# Patient Record
Sex: Female | Born: 2000
Health system: Southern US, Community
[De-identification: ages and names within clinical notes are randomized; demographics above are authoritative.]

## PROBLEM LIST (undated history)

## (undated) DIAGNOSIS — J45909 Unspecified asthma, uncomplicated: Secondary | ICD-10-CM

## (undated) DIAGNOSIS — F411 Generalized anxiety disorder: Secondary | ICD-10-CM

## (undated) DIAGNOSIS — J189 Pneumonia, unspecified organism: Secondary | ICD-10-CM

## (undated) DIAGNOSIS — J4520 Mild intermittent asthma, uncomplicated: Secondary | ICD-10-CM

## (undated) HISTORY — DX: Mild intermittent asthma, uncomplicated: J45.20

## (undated) HISTORY — PX: NO PAST SURGERIES: SHX2092

## (undated) HISTORY — DX: Pneumonia, unspecified organism: J18.9

## (undated) HISTORY — DX: Unspecified asthma, uncomplicated: J45.909

## (undated) HISTORY — DX: Generalized anxiety disorder: F41.1

---
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
## 2019-04-15 ENCOUNTER — Other Ambulatory Visit: Payer: Self-pay

## 2019-04-15 ENCOUNTER — Encounter: Payer: Self-pay | Admitting: Physician Assistant

## 2019-04-15 ENCOUNTER — Ambulatory Visit: Payer: BC Managed Care – PPO | Admitting: Physician Assistant

## 2019-04-15 VITALS — BP 132/81 | HR 84 | Temp 99.1°F | Ht <= 58 in | Wt 128.0 lb

## 2019-04-15 DIAGNOSIS — F411 Generalized anxiety disorder: Secondary | ICD-10-CM

## 2019-04-15 DIAGNOSIS — F39 Unspecified mood [affective] disorder: Secondary | ICD-10-CM | POA: Diagnosis not present

## 2019-04-15 DIAGNOSIS — F331 Major depressive disorder, recurrent, moderate: Secondary | ICD-10-CM | POA: Diagnosis not present

## 2019-04-15 DIAGNOSIS — Z7289 Other problems related to lifestyle: Secondary | ICD-10-CM | POA: Diagnosis not present

## 2019-04-15 MED ORDER — LAMOTRIGINE 25 MG PO TABS: 25.0000 mg | ORAL_TABLET | Freq: Every day | ORAL | 0 refills | Status: DC

## 2019-04-15 MED ORDER — ALBUTEROL SULFATE HFA 108 (90 BASE) MCG/ACT IN AERS: 2.0000 | INHALATION_SPRAY | Freq: Four times a day (QID) | RESPIRATORY_TRACT | 2 refills | Status: DC | PRN

## 2019-04-15 NOTE — Patient Instructions (Signed)
Lamotrigine tablets What is this medicine? LAMOTRIGINE (la MOE tri jeen) is used to control seizures in adults and children with epilepsy and Lennox-Gastaut syndrome. It is also used in adults to treat bipolar disorder. This medicine may be used for other purposes; ask your health care provider or pharmacist if you have questions. COMMON BRAND NAME(S): Lamictal, Subvenite What should I tell my health care provider before I take this medicine? They need to know if you have any of these conditions:  aseptic meningitis during prior use of lamotrigine  depression  folate deficiency  kidney disease  liver disease  suicidal thoughts, plans, or attempt; a previous suicide attempt by you or a family member  an unusual or allergic reaction to lamotrigine or other seizure medications, other medicines, foods, dyes, or preservatives  pregnant or trying to get pregnant  breast-feeding How should I use this medicine? Take this medicine by mouth with a glass of water. Follow the directions on the prescription label. Do not chew these tablets. If this medicine upsets your stomach, take it with food or milk. Take your doses at regular intervals. Do not take your medicine more often than directed. A special MedGuide will be given to you by the pharmacist with each new prescription and refill. Be sure to read this information carefully each time. Talk to your pediatrician regarding the use of this medicine in children. While this drug may be prescribed for children as young as 2 years for selected conditions, precautions do apply. Overdosage: If you think you have taken too much of this medicine contact a poison control center or emergency room at once. NOTE: This medicine is only for you. Do not share this medicine with others. What if I miss a dose? If you miss a dose, take it as soon as you can. If it is almost time for your next dose, take only that dose. Do not take double or extra doses. What may  interact with this medicine?  atazanavir  carbamazepine  female hormones, including contraceptive or birth control pills  lopinavir  methotrexate  phenobarbital  phenytoin  primidone  pyrimethamine  rifampin  ritonavir  trimethoprim  valproic acid This list may not describe all possible interactions. Give your health care provider a list of all the medicines, herbs, non-prescription drugs, or dietary supplements you use. Also tell them if you smoke, drink alcohol, or use illegal drugs. Some items may interact with your medicine. What should I watch for while using this medicine? Visit your doctor or health care provider for regular checks on your progress. If you take this medicine for seizures, wear a Medic Alert bracelet or necklace. Carry an identification card with information about your condition, medicines, and doctor or health care provider. It is important to take this medicine exactly as directed. When first starting treatment, your dose will need to be adjusted slowly. It may take weeks or months before your dose is stable. You should contact your doctor or health care provider if your seizures get worse or if you have any new types of seizures. Do not stop taking this medicine unless instructed by your doctor or health care provider. Stopping your medicine suddenly can increase your seizures or their severity. This medicine may cause serious skin reactions. They can happen weeks to months after starting the medicine. Contact your health care provider right away if you notice fevers or flu-like symptoms with a rash. The rash may be red or purple and then turn into blisters or peeling   of the skin. Or, you might notice a red rash with swelling of the face, lips or lymph nodes in your neck or under your arms. You may get drowsy, dizzy, or have blurred vision. Do not drive, use machinery, or do anything that needs mental alertness until you know how this medicine affects you. To  reduce dizzy or fainting spells, do not sit or stand up quickly, especially if you are an older patient. Alcohol can increase drowsiness and dizziness. Avoid alcoholic drinks. If you are taking this medicine for bipolar disorder, it is important to report any changes in your mood to your doctor or health care provider. If your condition gets worse, you get mentally depressed, feel very hyperactive or manic, have difficulty sleeping, or have thoughts of hurting yourself or committing suicide, you need to get help from your health care provider right away. If you are a caregiver for someone taking this medicine for bipolar disorder, you should also report these behavioral changes right away. The use of this medicine may increase the chance of suicidal thoughts or actions. Pay special attention to how you are responding while on this medicine. Your mouth may get dry. Chewing sugarless gum or sucking hard candy, and drinking plenty of water may help. Contact your doctor if the problem does not go away or is severe. Women who become pregnant while using this medicine may enroll in the [place] Antiepileptic Drug Pregnancy Registry by calling 1-888-233-2334. This registry collects information about the safety of antiepileptic drug use during pregnancy. This medicine may cause a decrease in folic acid. You should make sure that you get enough folic acid while you are taking this medicine. Discuss the foods you eat and the vitamins you take with your health care provider. What side effects may I notice from receiving this medicine? Side effects that you should report to your doctor or health care professional as soon as possible:  allergic reactions like skin rash, itching or hives, swelling of the face, lips, or tongue  changes in vision  depressed mood  elevated mood, decreased need for sleep, racing thoughts, impulsive behavior  loss of balance or coordination  mouth sores  rash, fever, and  swollen lymph nodes  redness, blistering, peeling or loosening of the skin, including inside the mouth  right upper belly pain  seizures  severe muscle pain  signs and symptoms of aseptic meningitis such as stiff neck and sensitivity to light, headache, drowsiness, fever, nausea, vomiting, rash  signs of infection - fever or chills, cough, sore throat, pain or difficulty passing urine  suicidal thoughts or other mood changes  swollen lymph nodes  trouble walking  unusual bruising or bleeding  unusually weak or tired  yellowing of the eyes or skin Side effects that usually do not require medical attention (report to your doctor or health care professional if they continue or are bothersome):  diarrhea  dizziness  dry mouth  stuffy nose  tiredness  tremors  trouble sleeping This list may not describe all possible side effects. Call your doctor for medical advice about side effects. You may report side effects to FDA at 1-800-FDA-1088. Where should I keep my medicine? Keep out of reach of children. Store at room temperature between 15 and 30 degrees C (59 and 86 degrees F). Throw away any unused medicine after the expiration date. NOTE: This sheet is a summary. It may not cover all possible information. If you have questions about this medicine, talk to your doctor,   pharmacist, or health care provider.  2020 Elsevier/Gold Standard (2018-08-16 15:03:40)  

## 2019-04-15 NOTE — Progress Notes (Signed)
Subjective:    Patient ID: Lauren Zamora, female    DOB: Oct 16, 2000, 18 y.o.   MRN: [id number]  HPI  Patient is a 18 year old female who presents to the clinic with her mother to discuss her mood.  Patient has a history of anxiety and depression.  About 2 years ago she was started on Celexa.  She has had pretty good benefits with Celexa for a while.  We increased it recently and she got more benefit.  She is still noticing more more problem with depression and mood in general.  She is a Printmaker at [employer].  There are days where she does not want to get out of bed.  Her friends have to make her get up for class.  She admits to cutting herself twice.  She feels like she does not want to do these things but feels like she has to do something.  She denies any suicidal thoughts or idealizations.  She denies any homicidal thoughts.  She denies any hallucinations.  She also has times where she feels like she can get a lot more done and feels so much better.  She is concerned because there is a strong family history of bipolar, anxiety, depression, PMDD.  She feels like she could have bipolar.  Mother-Anxiety, Depression, PMDD. Maternal Aunt-Bipolar  .Marland Kitchen Active Ambulatory Problems    Diagnosis Date Noted  . GAD (generalized anxiety disorder) 08/11/2015  . Asthma, mild intermittent 08/11/2015  . Non-seasonal allergic rhinitis 11/30/2016  . Dysmenorrhea in adolescent 12/13/2016  . Menorrhagia with regular cycle 12/13/2016  . Low folate 12/19/2016  . Persistent headaches 07/06/2017  . Post concussion syndrome 07/06/2017  . Panic attacks 10/02/2017  . Weight gain 10/02/2017  . No energy 10/02/2017  . Folic acid deficiency 10/03/2017  . Moderate episode of recurrent major depressive disorder (HCC) 04/16/2019  . Mood disorder (HCC) 04/16/2019  . Deliberate self-cutting 04/16/2019   Resolved Ambulatory Problems    Diagnosis Date Noted  . ASTHMA, ACUTE 01/07/2009  . Left cervical lymphadenopathy  10/15/2015  . Pneumonitis 11/20/2016   Past Medical History:  Diagnosis Date  . Asthma      Review of Systems See HPI>     Objective:   Physical Exam Vitals signs reviewed.  Constitutional:      Appearance: Normal appearance.  HENT:     Head: Normocephalic.  Cardiovascular:     Rate and Rhythm: Normal rate and regular rhythm.     Pulses: Normal pulses.  Pulmonary:     Effort: Pulmonary effort is normal.     Breath sounds: Normal breath sounds.  Neurological:     General: No focal deficit present.     Mental Status: She is alert and oriented to person, place, and time.  Psychiatric:        Mood and Affect: Mood normal.        Behavior: Behavior normal.       .. Depression screen [facility] 2/9 04/15/2019 12/18/2018 07/10/2018 07/04/2017 11/20/2016  Decreased Interest 3 1 0 1 0  Down, Depressed, Hopeless 2 2 0 1 0  PHQ - 2 Score 5 3 0 2 0  Altered sleeping 3 3 2 2  0  Tired, decreased energy 3 3 3 3  0  Change in appetite 2 2 3  0 0  Feeling bad or failure about yourself  2 1 1  0 0  Trouble concentrating 3 1 0 1 0  Moving slowly or fidgety/restless 0 0 0 0 0  Suicidal thoughts  1 0 0 0 0  PHQ-9 Score 19 13 9 8  0  Difficult doing work/chores Extremely dIfficult Somewhat difficult Somewhat difficult Somewhat difficult -   .. GAD 7 : Generalized Anxiety Score 04/15/2019 12/18/2018 07/10/2018  Nervous, Anxious, on Edge 3 3 3   Control/stop worrying 3 2 2   Worry too much - different things 3 2 3   Trouble relaxing 3 1 3   Restless 2 0 0  Easily annoyed or irritable 3 3 3   Afraid - awful might happen 0 0 0  Total GAD 7 Score 17 11 14   Anxiety Difficulty Extremely difficult Somewhat difficult Very difficult   MDQ was positive with greater than 7 positive out of 13.      Assessment & Plan:  Marland KitchenMarland KitchenHaleigh was seen today for follow-up.  Diagnoses and all orders for this visit:  Moderate episode of recurrent major depressive disorder ([place]) -     Ambulatory referral to  Psychiatry  Mood disorder ([place]) -     lamoTRIgine (LAMICTAL) 25 MG tablet; Take 1 tablet (25 mg total) by mouth daily. -     Ambulatory referral to Psychiatry  GAD (generalized anxiety disorder) -     Ambulatory referral to Psychiatry  Deliberate self-cutting -     Ambulatory referral to Psychiatry  Other orders -     albuterol (VENTOLIN HFA) 108 (90 Base) MCG/ACT inhaler; Inhale 2 puffs into the lungs every 6 (six) hours as needed for wheezing or shortness of breath.   PHQ 9, GAD 7, MDQ were all elevated and positive for anxiety, depression, mood disorder.  I would like to send patient to psychiatry for more firm diagnosis.  Patient will continue on Celexa 20 mg.  I will add Lamictal 25 mg and may increase after 2 weeks to 50 mg.  Consider getting a counselor to help get her through the ups and downs of life and hold her accountable. Pt declines referral at this time. Will consider school counseling.  Encouraged regular exercise.  Follow-up as needed.

## 2019-04-16 ENCOUNTER — Encounter: Payer: Self-pay | Admitting: Physician Assistant

## 2019-04-16 DIAGNOSIS — Z7289 Other problems related to lifestyle: Secondary | ICD-10-CM | POA: Insufficient documentation

## 2019-04-16 DIAGNOSIS — F39 Unspecified mood [affective] disorder: Secondary | ICD-10-CM | POA: Insufficient documentation

## 2019-04-16 DIAGNOSIS — F331 Major depressive disorder, recurrent, moderate: Secondary | ICD-10-CM | POA: Insufficient documentation

## 2019-04-26 IMAGING — DX DG CHEST 2V
2 series · 2 of 2 positions shown · non-contrast
Comparison: No recent prior .

CLINICAL DATA: Cough.

EXAM:
CHEST  2 VIEW

[chest pa]
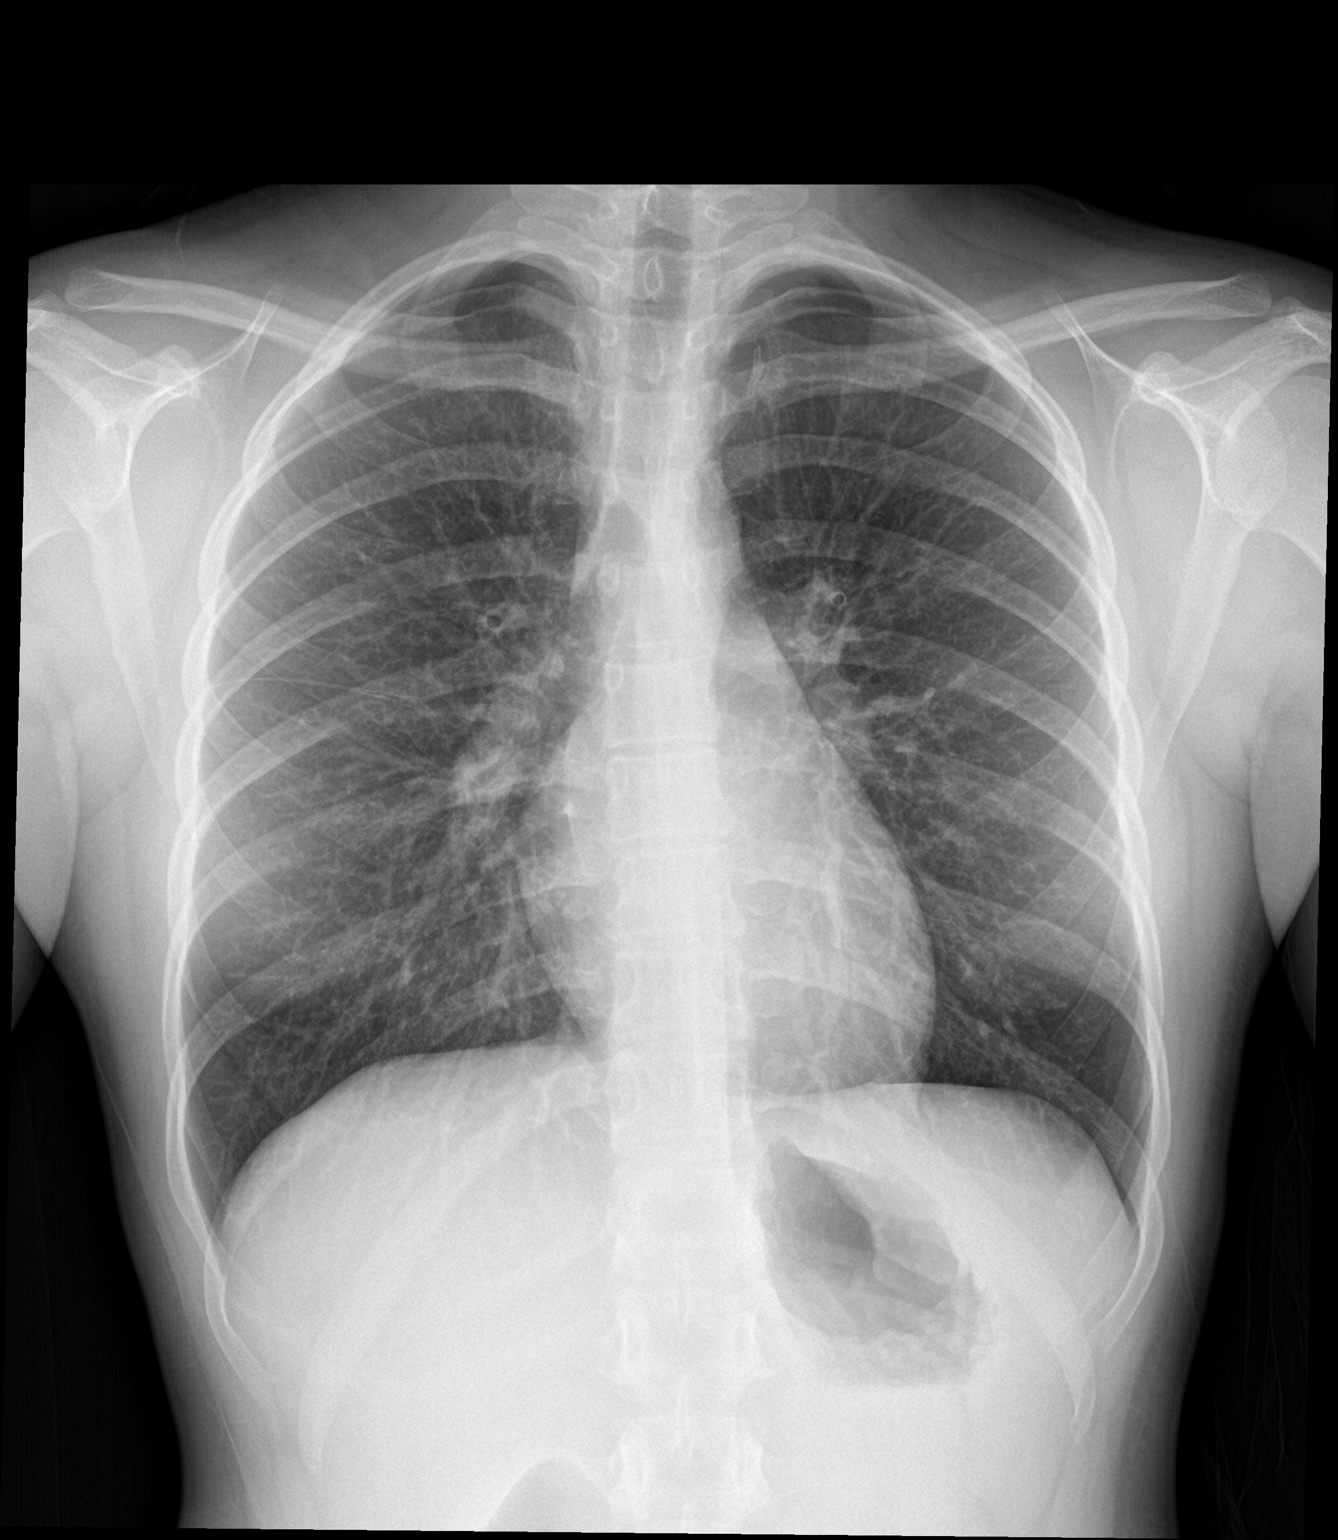

[chest lat]
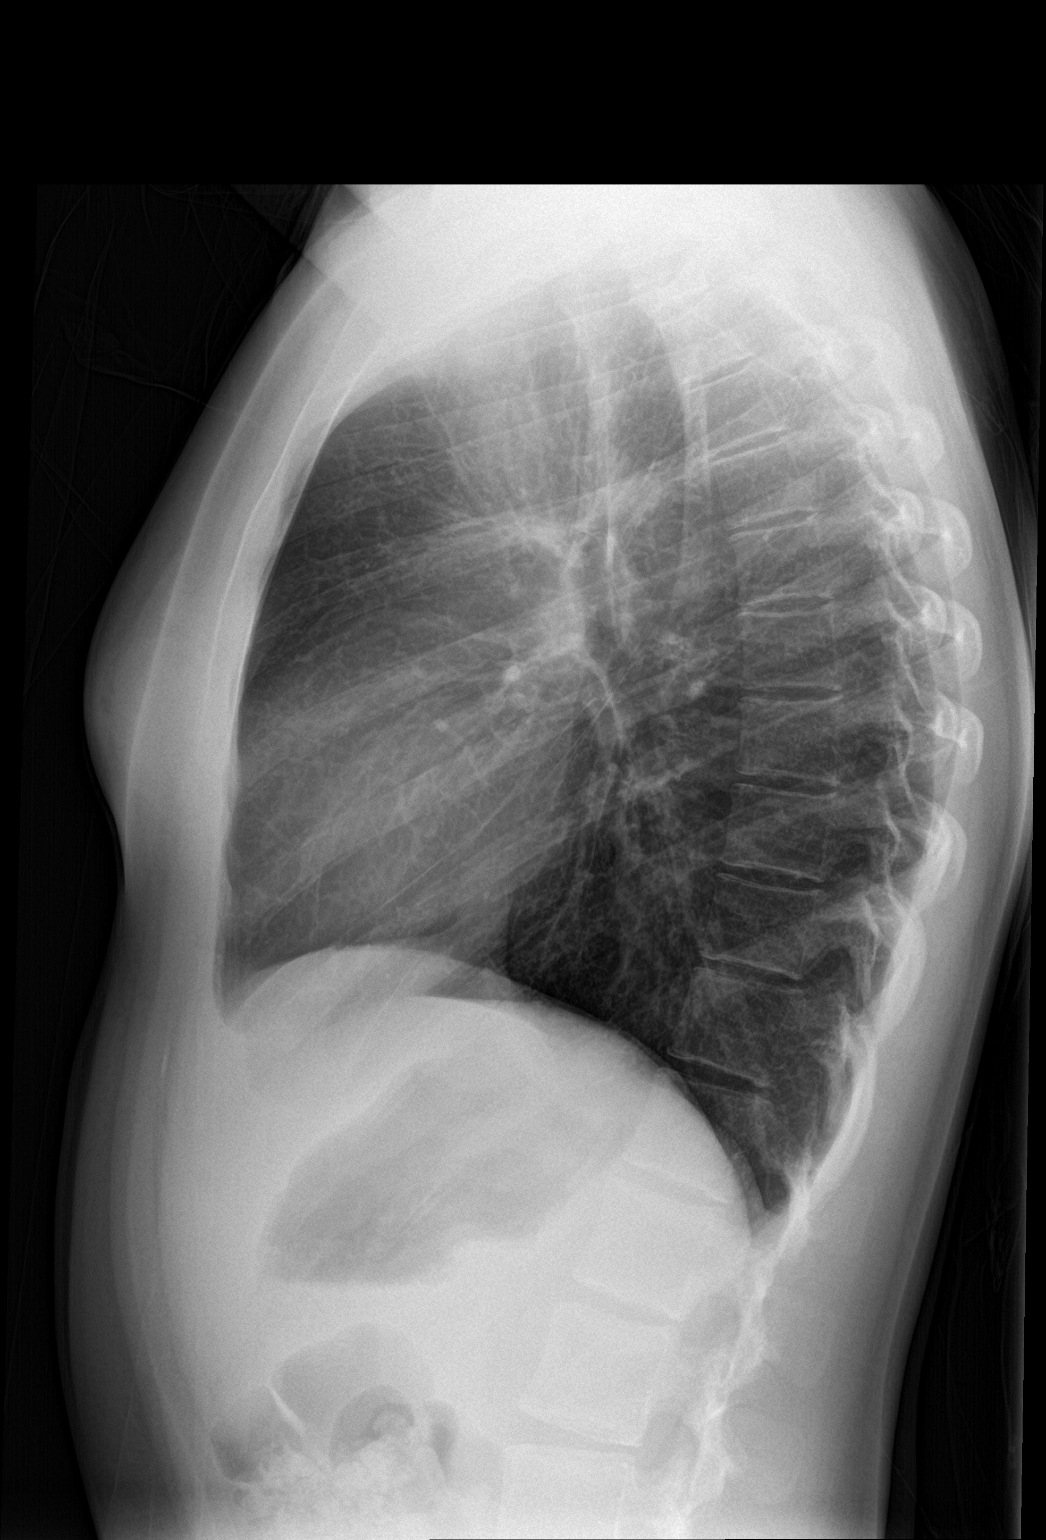

[2 of 2 positions shown; findings below may reference images not displayed]

FINDINGS: Mediastinum and hilar structures are normal. Heart size normal.
Bilateral perihilar interstitial prominence noted consistent with
pneumonitis. No pleural effusion or pneumothorax
IMPRESSION: Bilateral perihilar interstitial prominence noted consistent with
pneumonitis.

## 2019-05-05 ENCOUNTER — Ambulatory Visit (INDEPENDENT_AMBULATORY_CARE_PROVIDER_SITE_OTHER): Payer: BC Managed Care – PPO | Admitting: Psychiatry

## 2019-05-05 ENCOUNTER — Encounter (HOSPITAL_COMMUNITY): Payer: Self-pay | Admitting: Psychiatry

## 2019-05-05 DIAGNOSIS — F411 Generalized anxiety disorder: Secondary | ICD-10-CM | POA: Diagnosis not present

## 2019-05-05 DIAGNOSIS — F410 Panic disorder [episodic paroxysmal anxiety] without agoraphobia: Secondary | ICD-10-CM

## 2019-05-05 DIAGNOSIS — F39 Unspecified mood [affective] disorder: Secondary | ICD-10-CM

## 2019-05-05 MED ORDER — LAMOTRIGINE 25 MG PO TABS: 25.0000 mg | ORAL_TABLET | Freq: Every day | ORAL | 0 refills | Status: DC

## 2019-05-05 NOTE — Progress Notes (Signed)
Psychiatric Initial Adult Assessment   Patient Identification: Lauren Zamora MRN:  [id number] Date of Evaluation:  05/05/2019 Referral Source:J Rayburn MaBreebak, primary care office Chief Complaint:   Chief Complaint    Establish Care; Depression     Visit Diagnosis:    ICD-10-CM   1. Mood disorder (HCC)  F39 lamoTRIgine (LAMICTAL) 25 MG tablet  2. Panic attacks  F41.0   3. GAD (generalized anxiety disorder)  F41.1    I connected with Connee Wombles on 05/05/19 at  2:00 PM EST by a video enabled telemedicine application and verified that I am speaking with the correct person using two identifiers.   I discussed the limitations of evaluation and management by telemedicine and the availability of in person appointments. The patient expressed understanding and agreed to proceed. History of Present Illness: Patient is a 18 years old currently single Caucasian female referred by primary care physician for management of mood symptoms and depression. [employer] freshman  Patient is currently off the plan and recently started on Lamictal at the dose of 25 mg.  She has had history of episodes of depression in the past starting in middle school when there was a difficult time in relationship with a boyfriend and that led to distant with her parents.  She was started on therapy at that time  Patient got close to parents again in high school  Patient presents since middle school she has had episodes of depression.  Recently is now [employer] Stress related to school,   Recently she has had panic-like attacks when she gets overwhelmed and also gets moody irritable easily.  She does have happy hours when around friends, energy and upbeat lasts for few hours Recently prior starting lamictal has noticed impulsiveness and self cut 2 times when overwhelemed  No psychosis, no clear manic symptoms More so generalized worries, excessive at times She feels lamictal has helped anxiety and mood symptoms Less anxiuos.  Not self cutting  But notices tiredness during the morning No rash  Aggravating F: school stress Modiyfing factor: parents, dogs  Duration : since middle school  Denies drug use, no prior psych treatment or admission.    Past Psychiatric History: depression  Previous Psychotropic Medications: Yes   Substance Abuse History in the last 12 months:  No.  Consequences of Substance Abuse: NA  Past Medical History:  Past Medical History:  Diagnosis Date  . Asthma   . Asthma, mild intermittent 08/11/2015  . GAD (generalized anxiety disorder) 08/11/2015   GAD7 score 3 11/20/16  . Pneumonitis 11/20/2016    Past Surgical History:  Procedure Laterality Date  . NO PAST SURGERIES      Family Psychiatric History: depression/mother   Family History:  Family History  Problem Relation Age of Onset  . Anxiety disorder Mother   . Depression Mother   . Parkinson's disease Maternal Grandmother   . Stroke Paternal Grandfather   . Bipolar disorder Maternal Aunt     Social History:   Social History   Socioeconomic History  . Marital status: Single    Spouse name: Not on file  . Number of children: Not on file  . Years of education: Not on file  . Highest education level: Not on file  Occupational History  . Not on file  Social Needs  . Financial resource strain: Not on file  . Food insecurity    Worry: Not on file    Inability: Not on file  . Transportation needs    Medical: Not  on file    Non-medical: Not on file  Tobacco Use  . Smoking status: Never Smoker  . Smokeless tobacco: Never Used  Substance and Sexual Activity  . Alcohol use: No    Alcohol/week: 0.0 standard drinks  . Drug use: No  . Sexual activity: Never    Birth control/protection: Abstinence  Lifestyle  . Physical activity    Days per week: Not on file    Minutes per session: Not on file  . Stress: Not on file  Relationships  . Social Musician on phone: Not on file    Gets together:  Not on file    Attends religious service: Not on file    Active member of club or organization: Not on file    Attends meetings of clubs or organizations: Not on file    Relationship status: Not on file  Other Topics Concern  . Not on file  Social History Narrative  . Not on file    Additional Social History: grew up with parents. No abuse  Allergies:   Allergies  Allergen Reactions  . Bee Venom     Metabolic Disorder Labs: No results found for: HGBA1C, MPG No results found for: PROLACTIN No results found for: CHOL, TRIG, HDL, CHOLHDL, VLDL, LDLCALC Lab Results  Component Value Date   TSH 1.04 10/02/2017    Therapeutic Level Labs: No results found for: LITHIUM No results found for: CBMZ No results found for: VALPROATE  Current Medications: Current Outpatient Medications  Medication Sig Dispense Refill  . albuterol (VENTOLIN HFA) 108 (90 Base) MCG/ACT inhaler Inhale 2 puffs into the lungs every 6 (six) hours as needed for wheezing or shortness of breath. 18 g 2  . citalopram (CELEXA) 20 MG tablet Take 1 tablet (20 mg total) by mouth daily. 90 tablet 1  . EPINEPHrine 0.3 mg/0.3 mL IJ SOAJ injection Inject 0.3 mg into the muscle once as needed.    . hydrOXYzine (ATARAX/VISTARIL) 10 MG tablet Take 1 tablet (10 mg total) by mouth 3 (three) times daily as needed. 60 tablet 1  . ibuprofen (ADVIL,MOTRIN) 600 MG tablet TAKE 1 TABLET (600 MG TOTAL) BY MOUTH EVERY 8 (EIGHT) HOURS AS NEEDED. 90 tablet 0  . lamoTRIgine (LAMICTAL) 25 MG tablet Take 1 tablet (25 mg total) by mouth daily. 30 tablet 0  . montelukast (SINGULAIR) 10 MG tablet Take 1 tablet (10 mg total) by mouth at bedtime. 90 tablet 4  . norethindrone-ethinyl estradiol (JUNEL FE 1/20) 1-20 MG-MCG tablet Take 1 tablet by mouth daily. 3 Package 4   No current facility-administered medications for this visit.       Psychiatric Specialty Exam: Review of Systems  Cardiovascular: Negative for chest pain.  Skin: Negative  for rash.  Psychiatric/Behavioral: Negative for substance abuse and suicidal ideas.    There were no vitals taken for this visit.There is no height or weight on file to calculate BMI.  General Appearance: Casual  Eye Contact:  Fair  Speech:  Slow  Volume:  Normal  Mood:  somewhat subdued  Affect:  Congruent  Thought Process:  Goal Directed  Orientation:  Full (Time, Place, and Person)  Thought Content:  Logical  Suicidal Thoughts:  No  Homicidal Thoughts:  No  Memory:  Immediate;   Fair Recent;   Fair  Judgement:  Fair  Insight:  Shallow  Psychomotor Activity:  Normal  Concentration:  Concentration: Fair and Attention Span: Fair  Recall:  [employer] of Knowledge:Good  Language: Good  Akathisia:  No  Handed:    AIMS (if indicated):  not done  Assets:  Desire for Improvement Physical Health  ADL's:  Intact  Cognition: WNL  Sleep:  Fair   Screenings: GAD-7     Office Visit from 04/15/2019 in [facility] Office Visit from 12/18/2018 in [place] Office Visit from 07/10/2018 in [place]  Total GAD-7 Score  17  11  14     PHQ2-9     Office Visit from 04/15/2019 in [place] Office Visit from 12/18/2018 in [place] Office Visit from 07/10/2018 in [place] Office Visit from 07/04/2017 in [facility] Office Visit from 11/20/2016 in [place]  PHQ-2 Total Score  5  3  0  2  0  PHQ-9 Total Score  19  13  9  8   0      Assessment and Plan: as follows  Mood disorder unspecified; on lamictal, some improvement , will continue low dose since she gets tired in the morning, can increase if needed No rash GAD with panic: continue celexa  MDD: recurrent: continue above meds Consider therapy to  deal with stressors and in the past concerns    I discussed the assessment and treatment plan with the patient. The patient was provided an opportunity to ask questions and all were answered. The patient agreed with the plan and demonstrated an understanding of the instructions.   The patient was advised to call back or seek an in-person evaluation if the symptoms worsen or if the condition fails to improve as anticipated.  I provided 45  minutes of non-face-to-face time during this encounter. Fu 3-4 w or earlier if needed      Merian Capron, MD 12/7/20202:29 PM

## 2019-05-10 ENCOUNTER — Other Ambulatory Visit: Payer: Self-pay | Admitting: Physician Assistant

## 2019-05-10 DIAGNOSIS — F39 Unspecified mood [affective] disorder: Secondary | ICD-10-CM

## 2019-05-29 ENCOUNTER — Encounter (HOSPITAL_COMMUNITY): Payer: Self-pay | Admitting: Psychiatry

## 2019-05-29 ENCOUNTER — Ambulatory Visit (INDEPENDENT_AMBULATORY_CARE_PROVIDER_SITE_OTHER): Payer: BC Managed Care – PPO | Admitting: Psychiatry

## 2019-05-29 DIAGNOSIS — F422 Mixed obsessional thoughts and acts: Secondary | ICD-10-CM

## 2019-05-29 DIAGNOSIS — F411 Generalized anxiety disorder: Secondary | ICD-10-CM

## 2019-05-29 DIAGNOSIS — F410 Panic disorder [episodic paroxysmal anxiety] without agoraphobia: Secondary | ICD-10-CM | POA: Diagnosis not present

## 2019-05-29 DIAGNOSIS — F39 Unspecified mood [affective] disorder: Secondary | ICD-10-CM

## 2019-05-29 MED ORDER — LAMOTRIGINE 25 MG PO TABS: 25.0000 mg | ORAL_TABLET | Freq: Every day | ORAL | 1 refills | Status: DC

## 2019-05-29 NOTE — Progress Notes (Signed)
[facility] Follow up visit  Patient Identification: Lauren AnonHaleigh Hritz MRN:  [id number] Date of Evaluation:  05/29/2019 Referral Source:J Rayburn MaBreebak, primary care office Chief Complaint:   anxiety  Visit Diagnosis:    ICD-10-CM   1. Panic attacks  F41.0   2. Mood disorder (HCC)  F39 lamoTRIgine (LAMICTAL) 25 MG tablet  3. GAD (generalized anxiety disorder)  F41.1   4. Mixed obsessional thoughts and acts  F42.2    I connected with Latresha Furnari on 05/29/19 at  1:30 PM EST by a video enabled telemedicine application and verified that I am speaking with the correct person using two identifiers.      I discussed the limitations of evaluation and management by telemedicine and the availability of in person appointments. The patient expressed understanding and agreed to proceed. History of Present Illness: Patient is a 18 years old currently single Caucasian female referred by primary care physician for management of mood symptoms and depression. [employer] freshman  Some better in mood and anxiety with lamictal and celexa Doing fair back at home Some intrusive toughts and compulsions of counting which at times gets worse   Patient presents since middle school she has had episodes of depression.  Recently is now [employer] freshman Stress related to school,   Less panicky Less anxiuos. Not self cutting  No rash  Aggravating F: school stress Modiyfing factor: parents, dogs Duration : since middle school  Denies drug use, no prior psych treatment or admission.    Past Psychiatric History: depression  Previous Psychotropic Medications: Yes   Substance Abuse History in the last 12 months:  No.  Consequences of Substance Abuse: NA  Past Medical History:  Past Medical History:  Diagnosis Date  . Asthma   . Asthma, mild intermittent 08/11/2015  . GAD (generalized anxiety disorder) 08/11/2015   GAD7 score 3 11/20/16  . Pneumonitis 11/20/2016    Past Surgical History:  Procedure Laterality Date  .  NO PAST SURGERIES      Family Psychiatric History: depression/mother   Family History:  Family History  Problem Relation Age of Onset  . Anxiety disorder Mother   . Depression Mother   . Parkinson's disease Maternal Grandmother   . Stroke Paternal Grandfather   . Bipolar disorder Maternal Aunt     Social History:   Social History   Socioeconomic History  . Marital status: Single    Spouse name: Not on file  . Number of children: Not on file  . Years of education: Not on file  . Highest education level: Not on file  Occupational History  . Not on file  Tobacco Use  . Smoking status: Never Smoker  . Smokeless tobacco: Never Used  Substance and Sexual Activity  . Alcohol use: No    Alcohol/week: 0.0 standard drinks  . Drug use: No  . Sexual activity: Never    Birth control/protection: Abstinence  Other Topics Concern  . Not on file  Social History Narrative  . Not on file   Social Determinants of Health   Financial Resource Strain:   . Difficulty of Paying Living Expenses: Not on file  Food Insecurity:   . Worried About Programme researcher, broadcasting/film/videounning Out of Food in the Last Year: Not on file  . Ran Out of Food in the Last Year: Not on file  Transportation Needs:   . Lack of Transportation (Medical): Not on file  . Lack of Transportation (Non-Medical): Not on file  Physical Activity:   . Days of Exercise per Week:  Not on file  . Minutes of Exercise per Session: Not on file  Stress:   . Feeling of Stress : Not on file  Social Connections:   . Frequency of Communication with Friends and Family: Not on file  . Frequency of Social Gatherings with Friends and Family: Not on file  . Attends Religious Services: Not on file  . Active Member of Clubs or Organizations: Not on file  . Attends Banker Meetings: Not on file  . Marital Status: Not on file    Additional Social History: grew up with parents. No abuse  Allergies:   Allergies  Allergen Reactions  . Bee Venom      Metabolic Disorder Labs: No results found for: HGBA1C, MPG No results found for: PROLACTIN No results found for: CHOL, TRIG, HDL, CHOLHDL, VLDL, LDLCALC Lab Results  Component Value Date   TSH 1.04 10/02/2017    Therapeutic Level Labs: No results found for: LITHIUM No results found for: CBMZ No results found for: VALPROATE  Current Medications: Current Outpatient Medications  Medication Sig Dispense Refill  . albuterol (VENTOLIN HFA) 108 (90 Base) MCG/ACT inhaler Inhale 2 puffs into the lungs every 6 (six) hours as needed for wheezing or shortness of breath. 18 g 2  . citalopram (CELEXA) 20 MG tablet Take 1 tablet (20 mg total) by mouth daily. 90 tablet 1  . EPINEPHrine 0.3 mg/0.3 mL IJ SOAJ injection Inject 0.3 mg into the muscle once as needed.    . hydrOXYzine (ATARAX/VISTARIL) 10 MG tablet Take 1 tablet (10 mg total) by mouth 3 (three) times daily as needed. 60 tablet 1  . ibuprofen (ADVIL,MOTRIN) 600 MG tablet TAKE 1 TABLET (600 MG TOTAL) BY MOUTH EVERY 8 (EIGHT) HOURS AS NEEDED. 90 tablet 0  . lamoTRIgine (LAMICTAL) 25 MG tablet Take 1 tablet (25 mg total) by mouth daily. 30 tablet 1  . montelukast (SINGULAIR) 10 MG tablet Take 1 tablet (10 mg total) by mouth at bedtime. 90 tablet 4  . norethindrone-ethinyl estradiol (JUNEL FE 1/20) 1-20 MG-MCG tablet Take 1 tablet by mouth daily. 3 Package 4   No current facility-administered medications for this visit.      Psychiatric Specialty Exam: Review of Systems  Cardiovascular: Negative for chest pain.  Skin: Negative for rash.  Psychiatric/Behavioral: Negative for substance abuse and suicidal ideas.    There were no vitals taken for this visit.There is no height or weight on file to calculate BMI.  General Appearance: Casual  Eye Contact:  Fair  Speech:  Slow  Volume:  Normal  Mood: better  Affect:  Congruent  Thought Process:  Goal Directed  Orientation:  Full (Time, Place, and Person)  Thought Content:  Logical   Suicidal Thoughts:  No  Homicidal Thoughts:  No  Memory:  Immediate;   Fair Recent;   Fair  Judgement:  Fair  Insight:  Shallow  Psychomotor Activity:  Normal  Concentration:  Concentration: Fair and Attention Span: Fair  Recall:  [employer] of Knowledge:Good  Language: Good  Akathisia:  No  Handed:    AIMS (if indicated):  not done  Assets:  Desire for Improvement Physical Health  ADL's:  Intact  Cognition: WNL  Sleep:  Fair   Screenings: GAD-7     Office Visit from 04/15/2019 in [facility] Office Visit from 12/18/2018 in [facility] Office Visit from 07/10/2018 in [facility]  Total GAD-7 Score  17  11  14     PHQ2-9     Office Visit from 04/15/2019 in [place] Office Visit from 12/18/2018 in [place] Office Visit from 07/10/2018 in [place] Office Visit from [facility]/2017 in [facility] Office Visit from 11/20/2016 in [place]  PHQ-2 Total Score  5  3  0  2  0  PHQ-9 Total Score  19  13  9  8   0      Assessment and Plan: as follows  Mood disorder unspecified;some better, continue lamictal No rash GAD with panic: endorses less worries but compulsive toughts. Increase celexa to 30mg  . Has 20mg  can use them one and half Refer to therapy for OCD syptoms  MDD: recurrent: continue above meds. Some better Consider therapy to deal with stressors and in the past concerns . Also for OCD traits    I discussed the assessment and treatment plan with the patient. The patient was provided an opportunity to ask questions and all were answered. The patient agreed with the plan and demonstrated an understanding of the instructions.   The patient was advised to call back or seek an in-person  evaluation if the symptoms worsen or if the condition fails to improve as anticipated.   Fu 6w.      Merian Capron, MD 12/31/20201:38 PM

## 2019-06-06 ENCOUNTER — Other Ambulatory Visit (HOSPITAL_COMMUNITY): Payer: Self-pay | Admitting: Psychiatry

## 2019-06-06 DIAGNOSIS — F39 Unspecified mood [affective] disorder: Secondary | ICD-10-CM

## 2019-06-13 ENCOUNTER — Other Ambulatory Visit: Payer: Self-pay | Admitting: Physician Assistant

## 2019-06-13 DIAGNOSIS — F411 Generalized anxiety disorder: Secondary | ICD-10-CM

## 2019-06-13 DIAGNOSIS — F410 Panic disorder [episodic paroxysmal anxiety] without agoraphobia: Secondary | ICD-10-CM

## 2019-06-29 ENCOUNTER — Other Ambulatory Visit (HOSPITAL_COMMUNITY): Payer: Self-pay | Admitting: Psychiatry

## 2019-06-29 DIAGNOSIS — F39 Unspecified mood [affective] disorder: Secondary | ICD-10-CM

## 2019-07-02 ENCOUNTER — Other Ambulatory Visit: Payer: Self-pay

## 2019-07-02 ENCOUNTER — Ambulatory Visit (INDEPENDENT_AMBULATORY_CARE_PROVIDER_SITE_OTHER): Payer: BC Managed Care – PPO

## 2019-07-02 ENCOUNTER — Encounter: Payer: Self-pay | Admitting: Physician Assistant

## 2019-07-02 ENCOUNTER — Ambulatory Visit (INDEPENDENT_AMBULATORY_CARE_PROVIDER_SITE_OTHER): Payer: BC Managed Care – PPO | Admitting: Physician Assistant

## 2019-07-02 VITALS — BP 117/60 | HR 77 | Wt 130.0 lb

## 2019-07-02 DIAGNOSIS — F410 Panic disorder [episodic paroxysmal anxiety] without agoraphobia: Secondary | ICD-10-CM

## 2019-07-02 DIAGNOSIS — F331 Major depressive disorder, recurrent, moderate: Secondary | ICD-10-CM

## 2019-07-02 DIAGNOSIS — R0789 Other chest pain: Secondary | ICD-10-CM | POA: Diagnosis not present

## 2019-07-02 DIAGNOSIS — F411 Generalized anxiety disorder: Secondary | ICD-10-CM | POA: Diagnosis not present

## 2019-07-02 DIAGNOSIS — R079 Chest pain, unspecified: Secondary | ICD-10-CM

## 2019-07-02 DIAGNOSIS — F39 Unspecified mood [affective] disorder: Secondary | ICD-10-CM

## 2019-07-02 MED ORDER — CLONAZEPAM 0.5 MG PO TABS: ORAL_TABLET | ORAL | 0 refills | Status: DC

## 2019-07-02 MED ORDER — CYCLOBENZAPRINE HCL 5 MG PO TABS: 5.0000 mg | ORAL_TABLET | Freq: Three times a day (TID) | ORAL | 0 refills | Status: DC | PRN

## 2019-07-02 NOTE — Patient Instructions (Addendum)
Ibuprofen 400-800mg  for chest wall pain for next 3-4 days.  Flexeril as needed.  Consider heating pad.

## 2019-07-02 NOTE — Progress Notes (Signed)
Subjective:    Patient ID: Lauren Zamora, female    DOB: [birth year], 19 y.o.   MRN: [id number]  HPI  Pt is a 19 yo female with mood disorder, anxiety, depression who presents to the clinic after having episode of SOB/panic/chest pain 4 days ago. Pt is seeing [place] and on celexa/lamictal and as needed vistaril. She admits to anxiety being higher right now. Her dad lost his job, school is very stressful. She is struggling with motivation to go to school. She has a hx of childhood asthma and reactive airwary disease. She occasionally uses her inhaler as needed. On Saturday she started having some feelings of SOB. She used inhaler and just made it worse. She also had CP, rapid breathing, extreme anxiety for about 1 hour. The worst was 10 minutes. She noticed CP and tenderness after her "panic was over". She just wanted to get everything checked out. She has been anxious before but nothing like this. No known trigger. She was in her room.   .. Active Ambulatory Problems    Diagnosis Date Noted  . GAD (generalized anxiety disorder) 08/11/2015  . Asthma, mild intermittent 08/11/2015  . Non-seasonal allergic rhinitis 11/30/2016  . Dysmenorrhea in adolescent 12/13/2016  . Menorrhagia with regular cycle 12/13/2016  . Low folate 12/19/2016  . Persistent headaches 07/06/2017  . Post concussion syndrome 07/06/2017  . Panic attacks 10/02/2017  . Weight gain 10/02/2017  . No energy 10/02/2017  . Folic acid deficiency 10/93/2355  . Moderate episode of recurrent major depressive disorder ([place]) 04/16/2019  . Mood disorder ([place]) 04/16/2019  . Deliberate self-cutting 04/16/2019  . Chest pain 07/07/2019   Resolved Ambulatory Problems    Diagnosis Date Noted  . ASTHMA, ACUTE 01/07/2009  . Left cervical lymphadenopathy 10/15/2015  . Pneumonitis 11/20/2016   Past Medical History:  Diagnosis Date  . Asthma      Review of Systems See HPI.     Objective:   Physical Exam Vitals reviewed.   Constitutional:      Appearance: She is well-developed.  HENT:     Head: Normocephalic.  Cardiovascular:     Heart sounds: Normal heart sounds.  Pulmonary:     Effort: Pulmonary effort is normal. No accessory muscle usage or respiratory distress.     Breath sounds: Normal breath sounds. No decreased breath sounds or wheezing.  Chest:     Chest wall: Tenderness present.     Comments: Upper and mid chest wall tenderness to palpation.  Neurological:     General: No focal deficit present.     Mental Status: She is alert and oriented to person, place, and time.  Psychiatric:        Mood and Affect: Mood is anxious.           Assessment & Plan:  Marland KitchenMarland KitchenHaleigh was seen today for chest pain.  Diagnoses and all orders for this visit:  Chest pain, unspecified type -     EKG 12-Lead -     cyclobenzaprine (FLEXERIL) 5 MG tablet; Take 1 tablet (5 mg total) by mouth 3 (three) times daily as needed for muscle spasms. -     DG Chest 2 View  GAD (generalized anxiety disorder)  Moderate episode of recurrent major depressive disorder (HCC)  Panic attacks -     clonazePAM (KLONOPIN) 0.5 MG tablet; Take as needed for panic attack. -     DG Chest 2 View  Mood disorder (HCC)   EKG- NSR with sinus arrhthymia. No ST  elevation or depression. No acute changes.  Due to hx of asthma. CXR ordered. No acute changes.  Reassurance given.  This episodes sounded like a panic attack and post costochondritis/inflammation of chest. Discussed ice/heat and ibuprofen for next 5 days.  Flexeril given to use at bedtime. Follow up as needed.   This panic attack was intense. Follow up with BH. I do think albuterol could have made attack worse. I did give a few klonapin if vistaril is not helping to bring her down. Discussed dependency and controlled nature of the drug. Use sparingly.  Continue on all other daily medications.   Spent 37 minutes with patient.

## 2019-07-03 NOTE — Progress Notes (Signed)
Zara,  CXR looks great. No concerns.   Lesly Rubenstein

## 2019-07-07 ENCOUNTER — Encounter: Payer: Self-pay | Admitting: Physician Assistant

## 2019-07-07 DIAGNOSIS — R079 Chest pain, unspecified: Secondary | ICD-10-CM | POA: Insufficient documentation

## 2019-07-22 ENCOUNTER — Encounter (HOSPITAL_COMMUNITY): Payer: Self-pay | Admitting: Psychiatry

## 2019-07-22 ENCOUNTER — Ambulatory Visit (INDEPENDENT_AMBULATORY_CARE_PROVIDER_SITE_OTHER): Payer: BC Managed Care – PPO | Admitting: Psychiatry

## 2019-07-22 DIAGNOSIS — F410 Panic disorder [episodic paroxysmal anxiety] without agoraphobia: Secondary | ICD-10-CM | POA: Diagnosis not present

## 2019-07-22 DIAGNOSIS — F39 Unspecified mood [affective] disorder: Secondary | ICD-10-CM

## 2019-07-22 DIAGNOSIS — F411 Generalized anxiety disorder: Secondary | ICD-10-CM | POA: Diagnosis not present

## 2019-07-22 NOTE — Progress Notes (Signed)
[facility] Follow up visit  Patient Identification: Lauren Zamora MRN:  [id number] Date of Evaluation:  07/22/2019 Referral Source:J Jobe Igo, primary care office Chief Complaint:   anxiety  Visit Diagnosis:    ICD-10-CM   1. Mood disorder ([place])  F39   2. Panic attacks  F41.0   3. GAD (generalized anxiety disorder)  F41.1     I connected with Lauren Zamora on 07/22/19 at  1:30 PM EST by a video enabled telemedicine application and verified that I am speaking with the correct person using two identifiers.   I discussed the limitations of evaluation and management by telemedicine and the availability of in person appointments. The patient expressed understanding and agreed to proceed. History of Present Illness: Patient is a 19 years old currently single Caucasian female referred by primary care physician for management of mood symptoms and depression. 74 freshman  Was doing fair till a week ago had panic attack out of the blue,she has asthma so felt more anxious and breathing was heavy.  Had connected with primary care started low dose klonopine has hellped, takes prn  Says have been stressed trying to keep it under control and it may have led to panic Doing fair now Denies substance use Doing college from home   Patient presents since middle school she has had episodes of depression.  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    Past Psychiatric History: depression  Previous Psychotropic Medications: Yes   Substance Abuse History in the last 12 months:  No.  Consequences of Substance Abuse: NA  Past Medical History:  Past Medical History:  Diagnosis Date  . Asthma   . Asthma, mild intermittent 08/11/2015  . GAD (generalized anxiety disorder) 08/11/2015   GAD7 score 3 11/20/16  .  Pneumonitis 11/20/2016    Past Surgical History:  Procedure Laterality Date  . NO PAST SURGERIES      Family Psychiatric History: depression/mother   Family History:  Family History  Problem Relation Age of Onset  . Anxiety disorder Mother   . Depression Mother   . Parkinson's disease Maternal Grandmother   . Stroke Paternal Grandfather   . Bipolar disorder Maternal Aunt     Social History:   Social History   Socioeconomic History  . Marital status: Single    Spouse name: Not on file  . Number of children: Not on file  . Years of education: Not on file  . Highest education level: Not on file  Occupational History  . Not on file  Tobacco Use  . Smoking status: Never Smoker  . Smokeless tobacco: Never Used  Substance and Sexual Activity  . Alcohol use: No    Alcohol/week: 0.0 standard drinks  . Drug use: No  . Sexual activity: Never    Birth control/protection: Abstinence  Other Topics Concern  . Not on file  Social History Narrative  . Not on file   Social Determinants of Health   Financial Resource Strain:   . Difficulty of Paying Living Expenses: Not on file  Food Insecurity:   . Worried About Charity fundraiser in the Last Year: Not on file  . Ran Out of Food in the Last Year: Not on file  Transportation Needs:   . Lack of Transportation (Medical): Not on file  .  Lack of Transportation (Non-Medical): Not on file  Physical Activity:   . Days of Exercise per Week: Not on file  . Minutes of Exercise per Session: Not on file  Stress:   . Feeling of Stress : Not on file  Social Connections:   . Frequency of Communication with Friends and Family: Not on file  . Frequency of Social Gatherings with Friends and Family: Not on file  . Attends Religious Services: Not on file  . Active Member of Clubs or Organizations: Not on file  . Attends Banker Meetings: Not on file  . Marital Status: Not on file    Additional Social History: grew up with  parents. No abuse  Allergies:   Allergies  Allergen Reactions  . Bee Venom     Metabolic Disorder Labs: No results found for: HGBA1C, MPG No results found for: PROLACTIN No results found for: CHOL, TRIG, HDL, CHOLHDL, VLDL, LDLCALC Lab Results  Component Value Date   TSH 1.04 10/02/2017    Therapeutic Level Labs: No results found for: LITHIUM No results found for: CBMZ No results found for: VALPROATE  Current Medications: Current Outpatient Medications  Medication Sig Dispense Refill  . albuterol (VENTOLIN HFA) 108 (90 Base) MCG/ACT inhaler Inhale 2 puffs into the lungs every 6 (six) hours as needed for wheezing or shortness of breath. 18 g 2  . citalopram (CELEXA) 20 MG tablet TAKE 1 TABLET BY MOUTH EVERY DAY 90 tablet 1  . clonazePAM (KLONOPIN) 0.5 MG tablet Take as needed for panic attack. 20 tablet 0  . cyclobenzaprine (FLEXERIL) 5 MG tablet Take 1 tablet (5 mg total) by mouth 3 (three) times daily as needed for muscle spasms. 30 tablet 0  . EPINEPHrine 0.3 mg/0.3 mL IJ SOAJ injection Inject 0.3 mg into the muscle once as needed.    . hydrOXYzine (ATARAX/VISTARIL) 10 MG tablet Take 1 tablet (10 mg total) by mouth 3 (three) times daily as needed. 60 tablet 1  . ibuprofen (ADVIL,MOTRIN) 600 MG tablet TAKE 1 TABLET (600 MG TOTAL) BY MOUTH EVERY 8 (EIGHT) HOURS AS NEEDED. 90 tablet 0  . lamoTRIgine (LAMICTAL) 25 MG tablet TAKE 1 TABLET BY MOUTH EVERY DAY 30 tablet 1  . montelukast (SINGULAIR) 10 MG tablet Take 1 tablet (10 mg total) by mouth at bedtime. 90 tablet 4  . norethindrone-ethinyl estradiol (JUNEL FE 1/20) 1-20 MG-MCG tablet Take 1 tablet by mouth daily. 3 Package 4   No current facility-administered medications for this visit.      Psychiatric Specialty Exam: Review of Systems  Cardiovascular: Negative for chest pain.  Skin: Negative for rash.  Psychiatric/Behavioral: Negative for substance abuse and suicidal ideas.    There were no vitals taken for this  visit.There is no height or weight on file to calculate BMI.  General Appearance: Casual  Eye Contact:  Fair  Speech:  Slow  Volume:  Normal  Mood:fair  Affect:  Congruent  Thought Process:  Goal Directed  Orientation:  Full (Time, Place, and Person)  Thought Content:  Logical  Suicidal Thoughts:  No  Homicidal Thoughts:  No  Memory:  Immediate;   Fair Recent;   Fair  Judgement:  Fair  Insight:  Shallow  Psychomotor Activity:  Normal  Concentration:  Concentration: Fair and Attention Span: Fair  Recall:  [employer] of Knowledge:Good  Language: Good  Akathisia:  No  Handed:    AIMS (if indicated):  not done  Assets:  Desire for Improvement Physical Health  ADL's:  Intact  Cognition: WNL  Sleep:  Fair   Screenings: GAD-7     Office Visit from 04/15/2019 in [facility] Office Visit from 12/18/2018 in [facility] Office Visit from 07/10/2018 in [facility]  Total GAD-7 Score  17  11  14     PHQ2-9     Office Visit from 04/15/2019 in [facility] At [facility] Office Visit from 12/18/2018 in [facility] Office Visit from 07/10/2018 in [facility] At [facility] Office Visit from 07/04/2017 in [facility] At [facility] Office Visit from 11/20/2016 in [facility] At [facility]  PHQ-2 Total Score  5  3  0  2  0  PHQ-9 Total Score  19  13  9  8   0      Assessment and Plan: as follows  Mood disorder unspecified;fair, continue lamictal GAD with panic: had panic attack a week ago, klonopine prn helped, continue celexa Refer to therapy for OCD syptoms  MDD: recurrent: fair, continue celexa  Consider therapy to deal with stressors and in the past concerns . Also for OCD traits    I discussed the assessment and treatment plan with the patient. The patient was  provided an opportunity to ask questions and all were answered. The patient agreed with the plan and demonstrated an understanding of the instructions.   The patient was advised to call back or seek an in-person evaluation if the symptoms worsen or if the condition fails to improve as anticipated.   Fu 3-4 w       [facility], MD 2/23/20211:39 PM

## 2019-08-18 ENCOUNTER — Ambulatory Visit (HOSPITAL_COMMUNITY): Payer: BC Managed Care – PPO | Admitting: Psychiatry

## 2019-08-20 ENCOUNTER — Other Ambulatory Visit: Payer: Self-pay | Admitting: Physician Assistant

## 2019-08-20 DIAGNOSIS — N946 Dysmenorrhea, unspecified: Secondary | ICD-10-CM

## 2019-08-25 ENCOUNTER — Other Ambulatory Visit (HOSPITAL_COMMUNITY): Payer: Self-pay

## 2019-08-25 DIAGNOSIS — F39 Unspecified mood [affective] disorder: Secondary | ICD-10-CM

## 2019-08-25 MED ORDER — LAMOTRIGINE 25 MG PO TABS: 25.0000 mg | ORAL_TABLET | Freq: Every day | ORAL | 0 refills | Status: DC

## 2019-09-22 ENCOUNTER — Other Ambulatory Visit (HOSPITAL_COMMUNITY): Payer: Self-pay

## 2019-09-22 DIAGNOSIS — F39 Unspecified mood [affective] disorder: Secondary | ICD-10-CM

## 2019-09-22 MED ORDER — LAMOTRIGINE 25 MG PO TABS: 25.0000 mg | ORAL_TABLET | Freq: Every day | ORAL | 0 refills | Status: DC

## 2019-11-07 ENCOUNTER — Other Ambulatory Visit: Payer: Self-pay | Admitting: Physician Assistant

## 2019-11-07 DIAGNOSIS — F411 Generalized anxiety disorder: Secondary | ICD-10-CM

## 2019-11-07 DIAGNOSIS — F410 Panic disorder [episodic paroxysmal anxiety] without agoraphobia: Secondary | ICD-10-CM

## 2019-11-07 NOTE — Telephone Encounter (Signed)
Yes this should go to him.

## 2019-11-07 NOTE — Telephone Encounter (Signed)
Looks like patient saw Behavioral health who recommended to continue. Should she be getting RX from them?

## 2019-11-10 NOTE — Telephone Encounter (Signed)
Not seen her for 4 months. Was supposed to fu in 3-4 weeks,  If you have seen her and she is doing fine , you can refill Otherwise needs appointment

## 2019-11-10 NOTE — Telephone Encounter (Signed)
Call patient. Not had follow up with [facility] or me. Will need appt with one of [place] to get refills.

## 2019-11-11 ENCOUNTER — Encounter: Payer: Self-pay | Admitting: Physician Assistant

## 2019-11-11 NOTE — Telephone Encounter (Signed)
Tried calling patient and no answer. 

## 2019-11-11 NOTE — Telephone Encounter (Signed)
Please call for appt  

## 2019-12-03 ENCOUNTER — Encounter: Payer: Self-pay | Admitting: Physician Assistant

## 2019-12-03 ENCOUNTER — Ambulatory Visit (INDEPENDENT_AMBULATORY_CARE_PROVIDER_SITE_OTHER): Payer: BC Managed Care – PPO | Admitting: Physician Assistant

## 2019-12-03 ENCOUNTER — Other Ambulatory Visit: Payer: Self-pay

## 2019-12-03 VITALS — BP 127/76 | HR 83 | Ht <= 58 in | Wt 137.0 lb

## 2019-12-03 DIAGNOSIS — F39 Unspecified mood [affective] disorder: Secondary | ICD-10-CM

## 2019-12-03 DIAGNOSIS — R790 Abnormal level of blood mineral: Secondary | ICD-10-CM

## 2019-12-03 DIAGNOSIS — F410 Panic disorder [episodic paroxysmal anxiety] without agoraphobia: Secondary | ICD-10-CM | POA: Diagnosis not present

## 2019-12-03 DIAGNOSIS — F331 Major depressive disorder, recurrent, moderate: Secondary | ICD-10-CM

## 2019-12-03 DIAGNOSIS — Z13220 Encounter for screening for lipoid disorders: Secondary | ICD-10-CM | POA: Diagnosis not present

## 2019-12-03 DIAGNOSIS — E538 Deficiency of other specified B group vitamins: Secondary | ICD-10-CM | POA: Diagnosis not present

## 2019-12-03 DIAGNOSIS — Z79899 Other long term (current) drug therapy: Secondary | ICD-10-CM

## 2019-12-03 DIAGNOSIS — F411 Generalized anxiety disorder: Secondary | ICD-10-CM | POA: Diagnosis not present

## 2019-12-03 LAB — TSH: TSH: 1.06 mIU/L

## 2019-12-03 LAB — COMPLETE METABOLIC PANEL WITH GFR
AG Ratio: 1.6 (calc) (ref 1.0–2.5)
ALT: 14 U/L (ref 5–32)
AST: 13 U/L (ref 12–32)
Albumin: 4.2 g/dL (ref 3.6–5.1)
Alkaline phosphatase (APISO): 40 U/L (ref 36–128)
BUN: 13 mg/dL (ref 7–20)
CO2: 29 mmol/L (ref 20–32)
Calcium: 9.6 mg/dL (ref 8.9–10.4)
Chloride: 103 mmol/L (ref 98–110)
Creat: 0.66 mg/dL (ref 0.50–1.00)
GFR, Est African American: 148 mL/min/{1.73_m2} (ref >=60)
GFR, Est Non African American: 128 mL/min/{1.73_m2} (ref >=60)
Globulin: 2.6 g/dL (calc) (ref 2.0–3.8)
Glucose, Bld: 81 mg/dL (ref 65–99)
Potassium: 4.5 mmol/L (ref 3.8–5.1)
Sodium: 138 mmol/L (ref 135–146)
Total Bilirubin: 0.5 mg/dL (ref 0.2–1.1)
Total Protein: 6.8 g/dL (ref 6.3–8.2)

## 2019-12-03 LAB — CBC
HCT: 39.6 % (ref 35.0–45.0)
Hemoglobin: 13.1 g/dL (ref 11.7–15.5)
MCH: 30.3 pg (ref 27.0–33.0)
MCHC: 33.1 g/dL (ref 32.0–36.0)
MCV: 91.7 fL (ref 80.0–100.0)
MPV: 10 fL (ref 7.5–12.5)
Platelets: 286 10*3/uL (ref 140–400)
RBC: 4.32 10*6/uL (ref 3.80–5.10)
RDW: 12 % (ref 11.0–15.0)
WBC: 5 10*3/uL (ref 3.8–10.8)

## 2019-12-03 LAB — IRON,TIBC AND FERRITIN PANEL
%SAT: 41 % (calc) (ref 15–45)
Ferritin: 16 ng/mL (ref 16–154)
Iron: 167 ug/dL — ABNORMAL HIGH (ref 27–164)
TIBC: 411 mcg/dL (calc) (ref 271–448)

## 2019-12-03 LAB — LIPID PANEL W/REFLEX DIRECT LDL
Cholesterol: 190 mg/dL — ABNORMAL HIGH (ref <170)
HDL: 65 mg/dL (ref >45)
LDL Cholesterol (Calc): 101 mg/dL (calc) (ref <110)
Non-HDL Cholesterol (Calc): 125 mg/dL (calc) — ABNORMAL HIGH (ref <120)
Total CHOL/HDL Ratio: 2.9 (calc) (ref <5.0)
Triglycerides: 144 mg/dL — ABNORMAL HIGH (ref <90)

## 2019-12-03 LAB — B12 AND FOLATE PANEL
Folate: 12 ng/mL
Vitamin B-12: 313 pg/mL (ref 200–1100)

## 2019-12-03 MED ORDER — CITALOPRAM HYDROBROMIDE 40 MG PO TABS: 40.0000 mg | ORAL_TABLET | Freq: Every day | ORAL | 1 refills | Status: DC

## 2019-12-03 MED ORDER — LAMOTRIGINE 25 MG PO TABS: 25.0000 mg | ORAL_TABLET | Freq: Two times a day (BID) | ORAL | 0 refills | Status: DC

## 2019-12-03 NOTE — Progress Notes (Signed)
Subjective:    Patient ID: Lauren Zamora, female    DOB: Oct 19, 2000, 19 y.o.   MRN: [id number]  HPI Patient is a 19 year old female with mood disorder, anxiety, depression, panic attacks, low iron stores low vitamin B-12 who presents to the clinic for follow-up.  She is really not doing well with her anxiety and depression at all.  She does feel like her panic attacks have decreased.  She is only had 1 since her last visit.  She feels like she is increasingly less motivated to do anything.  She just wants to curl up in a ball and watched the world but not be a part of it.  She also feels equally anxious and worrisome.  She does admit to thinking about hurting herself.  She has cut her inner thigh and arm a few times and it seems to help somewhat with her anxiety.  She has not done this in the last few months.  She denies any plan of harming herself daily.  She is taking the Celexa and Lamictal.  She did decide to take a semester or year of school to help get better.  She does have a new job that she is happy about starting.  She has a supportive family.  .. Active Ambulatory Problems    Diagnosis Date Noted   GAD (generalized anxiety disorder) 08/11/2015   Asthma, mild intermittent 08/11/2015   Non-seasonal allergic rhinitis 11/30/2016   Dysmenorrhea in adolescent 12/13/2016   Menorrhagia with regular cycle 12/13/2016   Low folate 12/19/2016   Persistent headaches 07/06/2017   Post concussion syndrome 07/06/2017   Panic attacks 10/02/2017   Weight gain 10/02/2017   No energy 10/02/2017   Folic acid deficiency 10/03/2017   Moderate episode of recurrent major depressive disorder (HCC) 04/16/2019   Mood disorder (HCC) 04/16/2019   Deliberate self-cutting 04/16/2019   Chest pain 07/07/2019   Resolved Ambulatory Problems    Diagnosis Date Noted   ASTHMA, ACUTE 01/07/2009   Left cervical lymphadenopathy 10/15/2015   Pneumonitis 11/20/2016   Past Medical History:   Diagnosis Date   Asthma       Review of Systems See HPI.     Objective:   Physical Exam Vitals reviewed.  Constitutional:      Appearance: Normal appearance.  Cardiovascular:     Rate and Rhythm: Normal rate.  Neurological:     General: No focal deficit present.     Mental Status: She is alert and oriented to person, place, and time.  Psychiatric:        Mood and Affect: Mood normal.       .. GAD 7 : Generalized Anxiety Score 12/03/2019 04/15/2019 12/18/2018 07/10/2018  Nervous, Anxious, on Edge 3 3 3 3   Control/stop worrying 3 3 2 2   Worry too much - different things 3 3 2 3   Trouble relaxing 3 3 1 3   Restless 3 2 0 0  Easily annoyed or irritable 3 3 3 3   Afraid - awful might happen 3 0 0 0  Total GAD 7 Score 21 17 11 14   Anxiety Difficulty Extremely difficult Extremely difficult Somewhat difficult Very difficult    . Depression screen [facility] 2/9 12/03/2019 04/15/2019 12/18/2018 07/10/2018 07/04/2017  Decreased Interest 3 3 1  0 1  Down, Depressed, Hopeless 3 2 2  0 1  PHQ - 2 Score 6 5 3  0 2  Altered sleeping 3 3 3 2 2   Tired, decreased energy 3 3 3 3 3   Change  in appetite 3 2 2 3  0  Feeling bad or failure about yourself  3 2 1 1  0  Trouble concentrating 3 3 1  0 1  Moving slowly or fidgety/restless 2 0 0 0 0  Suicidal thoughts 1 1 0 0 0  PHQ-9 Score 24 19 13 9 8   Difficult doing work/chores Very difficult Extremely dIfficult Somewhat difficult Somewhat difficult Somewhat difficult       Assessment & Plan:   Lauren Zamora was seen today for depression.  Diagnoses and all orders for this visit:  Moderate episode of recurrent major depressive disorder (HCC) -     CBC -     citalopram (CELEXA) 40 MG tablet; Take 1 tablet (40 mg total) by mouth daily. -     TSH  Mood disorder (HCC) -     lamoTRIgine (LAMICTAL) 25 MG tablet; Take 1 tablet (25 mg total) by mouth 2 (two) times daily. -     CBC -     citalopram (CELEXA) 40 MG tablet; Take 1 tablet (40 mg total) by mouth  daily. -     TSH  GAD (generalized anxiety disorder) -     CBC -     citalopram (CELEXA) 40 MG tablet; Take 1 tablet (40 mg total) by mouth daily. -     TSH  Panic attacks -     CBC -     citalopram (CELEXA) 40 MG tablet; Take 1 tablet (40 mg total) by mouth daily. -     TSH  Medication management -     COMPLETE METABOLIC PANEL WITH GFR -     CBC -     B12 and Folate Panel -     Fe+TIBC+Fer -     TSH -     Lipid Panel w/reflex Direct LDL  Low iron stores -     CBC -     Fe+TIBC+Fer  Screening for lipid disorders -     Lipid Panel w/reflex Direct LDL  Low serum vitamin B12 -     B12 and Folate Panel   PHQ-9 and GAD-7 scores had increased.  Patient does not drink she needs CBT counseling but right now her insurance is not covering this.  They hope to have coverage in the next month or so.  She is due for some fasting follow-up labs.  These were ordered.  I did increase her Celexa to 40 mg as well as her Lamictal to 50 mg.  Follow-up in 4 weeks.  Continue to use Klonopin for panic attacks as needed.  Discussed meditation, grounding exercises, exercise.  Spent 30 minutes with patient.

## 2019-12-04 NOTE — Progress Notes (Signed)
[place],   Kidney, liver, glucose look great.  No anemia.  Thyroid good.  TG a little elevated. Watch sugars and carbs and processed foods. Consider fish oil 2000mg  a day OTC.  HDL, good cholesterol, great.  B12 is low. Start daily sublingual it could help you feel a little better.

## 2019-12-25 ENCOUNTER — Other Ambulatory Visit: Payer: Self-pay | Admitting: Physician Assistant

## 2019-12-25 DIAGNOSIS — F411 Generalized anxiety disorder: Secondary | ICD-10-CM

## 2019-12-25 DIAGNOSIS — F39 Unspecified mood [affective] disorder: Secondary | ICD-10-CM

## 2019-12-25 DIAGNOSIS — F331 Major depressive disorder, recurrent, moderate: Secondary | ICD-10-CM

## 2019-12-25 DIAGNOSIS — F410 Panic disorder [episodic paroxysmal anxiety] without agoraphobia: Secondary | ICD-10-CM

## 2019-12-31 ENCOUNTER — Other Ambulatory Visit: Payer: Self-pay

## 2019-12-31 ENCOUNTER — Ambulatory Visit (INDEPENDENT_AMBULATORY_CARE_PROVIDER_SITE_OTHER): Payer: BC Managed Care – PPO | Admitting: Physician Assistant

## 2019-12-31 ENCOUNTER — Encounter: Payer: Self-pay | Admitting: Physician Assistant

## 2019-12-31 VITALS — BP 124/61 | HR 85 | Temp 99.3°F | Ht <= 58 in | Wt 140.6 lb

## 2019-12-31 DIAGNOSIS — F411 Generalized anxiety disorder: Secondary | ICD-10-CM

## 2019-12-31 DIAGNOSIS — F39 Unspecified mood [affective] disorder: Secondary | ICD-10-CM | POA: Diagnosis not present

## 2019-12-31 DIAGNOSIS — F410 Panic disorder [episodic paroxysmal anxiety] without agoraphobia: Secondary | ICD-10-CM | POA: Diagnosis not present

## 2019-12-31 DIAGNOSIS — R632 Polyphagia: Secondary | ICD-10-CM

## 2019-12-31 DIAGNOSIS — F331 Major depressive disorder, recurrent, moderate: Secondary | ICD-10-CM | POA: Diagnosis not present

## 2019-12-31 MED ORDER — BUPROPION HCL ER (XL) 150 MG PO TB24: 150.0000 mg | ORAL_TABLET | ORAL | 1 refills | Status: DC

## 2019-12-31 MED ORDER — LAMOTRIGINE 100 MG PO TABS: 100.0000 mg | ORAL_TABLET | Freq: Every day | ORAL | 1 refills | Status: DC

## 2019-12-31 NOTE — Patient Instructions (Signed)
Binge-Eating Disorder Binge-eating disorder is a problem that involves repeated episodes of binge-eating. Binge-eating refers to eating a larger-than-normal amount of food in a short period of time, usually within 2 hours. People with this condition may eat even when they are not hungry, and they do not stop eating even when they feel full. People with binge-eating disorder feel unable to control their eating. Although they feel bad about overeating, they usually do not try to undo the bingeing by using laxatives or making themselves vomit. They do not starve themselves or exercise too much. Binge-eating disorder usually starts in the teenage years or early 20s. It often gets worse with stress. What are the causes? The cause of this condition is not known. What increases the risk? The following factors may make you more likely to develop this condition:  Being a teenager or in your early 20s.  Being female. Binge-eating disorder can affect males, but it is more common in females.  Being overweight or obese.  Having a mental health disorder, such as depression or anxiety.  Having a substance use disorder, such as alcohol use disorder.  Having a history of unhealthy dieting, such as meal skipping, yo-yo dieting, food restricting, or avoiding certain kinds of foods. What are the signs or symptoms? Symptoms of this condition include:  Eating much more quickly than normal.  Eating to the point of feeling physically uncomfortable.  Eating large amounts of food when you are not hungry.  Eating alone because you are embarrassed about how much you are eating.  Feeling disgusted, depressed, or guilty after overeating. How is this diagnosed? This condition is diagnosed through an assessment by your health care provider. You may be diagnosed with the disorder if you:  Binge-eat an average of one or more times a week for three months or longer.  Have three or more of the symptoms of the  disorder. Once you have been diagnosed, your level of binge-eating disorder will be rated from mild to severe. The rating is based on how often you binge-eat. How is this treated? This condition may be treated with:  Cognitive behavioral therapy (CBT). This is a form of talk therapy that helps you recognize the thoughts, beliefs, and emotions that contribute to overeating. It also helps you change them.  Interpersonal psychotherapy. This is a form of talk therapy that focuses on fixing relationship problems that trigger binge-eating episodes.  Dialectical behavioral therapy (DBT). This is a form of talk therapy that helps you learn skills to control your emotions and tolerate distress without binge-eating.  Medicine.  Weight-loss programs. These can be important if you are overweight. Losing excess weight can improve your physical health and the way you feel about yourself. Treatment is usually provided by mental health professionals, such as psychologists, psychiatrists, licensed professional counselors, and clinical social workers. Follow these instructions at home: Lifestyle      Eat a healthy diet that consists of lean meats and low-fat dairy products, as well as foods that are high in fiber, such as fresh fruits and vegetables, whole grains, and beans.  Work to develop a healthy relationship with food. Talk with your health care provider or a nutrition specialist (dietitian). He or she can provide guidance about healthy eating and healthy lifestyle choices.  Start an exercise routine and stay active. Aim for 30 or more minutes of exercise a day on 5 or more days a week to keep your body strong and healthy. You may need to exercise more if   you want to lose weight. Talk with your health care provider about how much and what type of exercise you can do. Some ways to be active include: ? Playing sports. ? Biking. ? Skating or skateboarding. ? Dancing. ? Running, walking, jogging, or  hiking. ? Doing yard work. General instructions  Take over-the-counter and prescription medicines only as told by your health care provider.  Drink enough fluid to keep your urine pale yellow.  Keep all follow-up visits as told by your health care provider. This is important. Where to find more information National Eating Disorders Association (NEDA): www.nationaleatingdisorders.org Contact a health care provider if:  Your symptoms get worse.  You start having new symptoms.  You start compensating for eating binges with harmful behaviors, such as: ? Making yourself vomit. ? Exercising too much. ? Using laxatives. Get help right away if:  You have serious thoughts about hurting yourself or someone else. If you ever feel like you may hurt yourself or others, or have thoughts about taking your own life, get help right away. You can go to your nearest emergency department or call:  Your local emergency services (911 in the [place]  A suicide crisis helpline, such as the National Suicide Prevention Lifeline at 1-800-273-8255. This is open 24 hours a day. Summary  You may have binge-eating disorder if you have feelings of guilt from overeating, eat to the point of feeling uncomfortable, eat a large amount of food in a short time, or find yourself eating when you are not hungry. Seek help from your health care provider.  The exact cause of a binge-eating disorder is not known. There are some risk factors for this disease, such as having a mental health disorder and having a history of unhealthy dieting.  There are a variety of treatment options such as counseling therapy, medicines, and learning healthy ways to lose or maintain your weight. These can help you overcome your binge-eating disorder. This information is not intended to replace advice given to you by your health care provider. Make sure you discuss any questions you have with your health care provider. Document Revised:  09/04/2018 Document Reviewed: 02/12/2017 Elsevier Patient Education  2020 [employer]  

## 2019-12-31 NOTE — Progress Notes (Signed)
Subjective:    Patient ID: Lauren Zamora, female    DOB: August 10, 2000, 19 y.o.   MRN: [id number]  HPI  Pt is a 19 yo female with GAD, MDD, Mood disorder, panic attacks who presents to the clinic for 4 week follow up.   She feels "better" but she is still having a significant amount of anxiety. Panic attacks have resolved. She is not in school but working a very stressful job. She denies any plans to hurt herself. She continues to often feel worthless.   She mentions binge eating at night. She will do good all day and eat everything in the refrigerator at night. She eats even though she is not hungry. She has no self control once she gets started with eating.   .. Active Ambulatory Problems    Diagnosis Date Noted   GAD (generalized anxiety disorder) 08/11/2015   Asthma, mild intermittent 08/11/2015   Non-seasonal allergic rhinitis 11/30/2016   Dysmenorrhea in adolescent 12/13/2016   Menorrhagia with regular cycle 12/13/2016   Low folate 12/19/2016   Persistent headaches 07/06/2017   Post concussion syndrome 07/06/2017   Panic attacks 10/02/2017   Weight gain 10/02/2017   No energy 10/02/2017   Folic acid deficiency 10/03/2017   Moderate episode of recurrent major depressive disorder (HCC) 04/16/2019   Mood disorder (HCC) 04/16/2019   Deliberate self-cutting 04/16/2019   Chest pain 07/07/2019   Binge eating 01/04/2020   Resolved Ambulatory Problems    Diagnosis Date Noted   ASTHMA, ACUTE 01/07/2009   Left cervical lymphadenopathy 10/15/2015   Pneumonitis 11/20/2016   Past Medical History:  Diagnosis Date   Asthma     Review of Systems See HPI.     Objective:   Physical Exam Vitals reviewed.  Constitutional:      Appearance: Normal appearance.  Cardiovascular:     Rate and Rhythm: Normal rate and regular rhythm.     Pulses: Normal pulses.  Pulmonary:     Effort: Pulmonary effort is normal.     Breath sounds: Normal breath sounds.   Neurological:     General: No focal deficit present.     Mental Status: She is alert and oriented to person, place, and time.  Psychiatric:        Mood and Affect: Mood normal.       .. Depression screen [facility] 2/9 12/31/2019 12/03/2019 04/15/2019 12/18/2018 07/10/2018  Decreased Interest 3 3 3 1  0  Down, Depressed, Hopeless 3 3 2 2  0  PHQ - 2 Score 6 6 5 3  0  Altered sleeping 3 3 3 3 2   Tired, decreased energy 3 3 3 3 3   Change in appetite 3 3 2 2 3   Feeling bad or failure about yourself  2 3 2 1 1   Trouble concentrating 3 3 3 1  0  Moving slowly or fidgety/restless 3 2 0 0 0  Suicidal thoughts 1 1 1  0 0  PHQ-9 Score 24 24 19 13 9   Difficult doing work/chores Extremely dIfficult Very difficult Extremely dIfficult Somewhat difficult Somewhat difficult  . GAD 7 : Generalized Anxiety Score 12/31/2019 12/03/2019 04/15/2019 12/18/2018  Nervous, Anxious, on Edge 3 3 3 3   Control/stop worrying 3 3 3 2   Worry too much - different things 3 3 3 2   Trouble relaxing 3 3 3 1   Restless 3 3 2  0  Easily annoyed or irritable 3 3 3 3   Afraid - awful might happen 3 3 0 0  Total GAD 7 Score  21 21 17 11   Anxiety Difficulty Extremely difficult Extremely difficult Extremely difficult Somewhat difficult         Assessment & Plan:  Marland KitchenHaleigh was seen today for follow-up.  Diagnoses and all orders for this visit:  GAD (generalized anxiety disorder) -     lamoTRIgine (LAMICTAL) 100 MG tablet; Take 1 tablet (100 mg total) by mouth daily.  Moderate episode of recurrent major depressive disorder (HCC) -     lamoTRIgine (LAMICTAL) 100 MG tablet; Take 1 tablet (100 mg total) by mouth daily.  Mood disorder (HCC) -     lamoTRIgine (LAMICTAL) 100 MG tablet; Take 1 tablet (100 mg total) by mouth daily.  Panic attacks  Binge eating -     buPROPion (WELLBUTRIN XL) 150 MG 24 hr tablet; Take 1 tablet (150 mg total) by mouth every morning.   PHQ/GAD numbers have not changed but patient reports to be feeling  better.  Stay on same dose of celexa.  Increased lamictal.  Added wellbutrin.  Discussed binge eating.  Pt is still trying to get in with counseling.  Follow up in 4 weeks with verbal of how she is doing.

## 2020-01-04 ENCOUNTER — Encounter: Payer: Self-pay | Admitting: Physician Assistant

## 2020-01-04 DIAGNOSIS — R632 Polyphagia: Secondary | ICD-10-CM | POA: Insufficient documentation

## 2020-02-24 ENCOUNTER — Other Ambulatory Visit: Payer: Self-pay | Admitting: Physician Assistant

## 2020-02-24 DIAGNOSIS — F39 Unspecified mood [affective] disorder: Secondary | ICD-10-CM

## 2020-02-24 DIAGNOSIS — F411 Generalized anxiety disorder: Secondary | ICD-10-CM

## 2020-02-24 DIAGNOSIS — R632 Polyphagia: Secondary | ICD-10-CM

## 2020-02-24 DIAGNOSIS — F331 Major depressive disorder, recurrent, moderate: Secondary | ICD-10-CM

## 2020-02-26 ENCOUNTER — Other Ambulatory Visit: Payer: Self-pay | Admitting: Physician Assistant

## 2020-02-26 DIAGNOSIS — F411 Generalized anxiety disorder: Secondary | ICD-10-CM

## 2020-02-26 DIAGNOSIS — F331 Major depressive disorder, recurrent, moderate: Secondary | ICD-10-CM

## 2020-02-26 DIAGNOSIS — F410 Panic disorder [episodic paroxysmal anxiety] without agoraphobia: Secondary | ICD-10-CM

## 2020-02-26 DIAGNOSIS — F39 Unspecified mood [affective] disorder: Secondary | ICD-10-CM

## 2020-03-02 DIAGNOSIS — F331 Major depressive disorder, recurrent, moderate: Secondary | ICD-10-CM | POA: Diagnosis not present

## 2020-03-09 DIAGNOSIS — F331 Major depressive disorder, recurrent, moderate: Secondary | ICD-10-CM | POA: Diagnosis not present

## 2020-03-11 ENCOUNTER — Other Ambulatory Visit: Payer: Self-pay | Admitting: Physician Assistant

## 2020-03-11 DIAGNOSIS — J301 Allergic rhinitis due to pollen: Secondary | ICD-10-CM

## 2020-03-18 ENCOUNTER — Other Ambulatory Visit: Payer: Self-pay | Admitting: Physician Assistant

## 2020-03-18 DIAGNOSIS — F39 Unspecified mood [affective] disorder: Secondary | ICD-10-CM

## 2020-03-18 DIAGNOSIS — F411 Generalized anxiety disorder: Secondary | ICD-10-CM

## 2020-03-18 DIAGNOSIS — F331 Major depressive disorder, recurrent, moderate: Secondary | ICD-10-CM

## 2020-03-18 DIAGNOSIS — F410 Panic disorder [episodic paroxysmal anxiety] without agoraphobia: Secondary | ICD-10-CM

## 2020-03-19 ENCOUNTER — Other Ambulatory Visit: Payer: Self-pay | Admitting: Physician Assistant

## 2020-03-19 DIAGNOSIS — F331 Major depressive disorder, recurrent, moderate: Secondary | ICD-10-CM

## 2020-03-19 DIAGNOSIS — F39 Unspecified mood [affective] disorder: Secondary | ICD-10-CM

## 2020-03-19 DIAGNOSIS — F411 Generalized anxiety disorder: Secondary | ICD-10-CM

## 2020-03-19 DIAGNOSIS — R632 Polyphagia: Secondary | ICD-10-CM

## 2020-03-20 ENCOUNTER — Other Ambulatory Visit: Payer: Self-pay | Admitting: Physician Assistant

## 2020-03-20 DIAGNOSIS — F331 Major depressive disorder, recurrent, moderate: Secondary | ICD-10-CM

## 2020-03-20 DIAGNOSIS — F411 Generalized anxiety disorder: Secondary | ICD-10-CM

## 2020-03-20 DIAGNOSIS — F39 Unspecified mood [affective] disorder: Secondary | ICD-10-CM

## 2020-03-20 DIAGNOSIS — R632 Polyphagia: Secondary | ICD-10-CM

## 2020-03-26 DIAGNOSIS — F331 Major depressive disorder, recurrent, moderate: Secondary | ICD-10-CM | POA: Diagnosis not present

## 2020-03-30 ENCOUNTER — Other Ambulatory Visit: Payer: Self-pay | Admitting: Physician Assistant

## 2020-03-30 DIAGNOSIS — R632 Polyphagia: Secondary | ICD-10-CM

## 2020-03-30 DIAGNOSIS — F331 Major depressive disorder, recurrent, moderate: Secondary | ICD-10-CM

## 2020-03-30 DIAGNOSIS — F411 Generalized anxiety disorder: Secondary | ICD-10-CM

## 2020-03-30 DIAGNOSIS — F39 Unspecified mood [affective] disorder: Secondary | ICD-10-CM

## 2020-04-01 ENCOUNTER — Other Ambulatory Visit: Payer: Self-pay | Admitting: Physician Assistant

## 2020-04-01 DIAGNOSIS — F39 Unspecified mood [affective] disorder: Secondary | ICD-10-CM

## 2020-04-01 DIAGNOSIS — F410 Panic disorder [episodic paroxysmal anxiety] without agoraphobia: Secondary | ICD-10-CM

## 2020-04-01 DIAGNOSIS — F331 Major depressive disorder, recurrent, moderate: Secondary | ICD-10-CM

## 2020-04-01 DIAGNOSIS — F411 Generalized anxiety disorder: Secondary | ICD-10-CM

## 2020-04-01 NOTE — Telephone Encounter (Signed)
Last written 07/02/2019 #20 no refills Last appt 12/31/2019 Was to follow up in 4 weeks

## 2020-04-02 DIAGNOSIS — F331 Major depressive disorder, recurrent, moderate: Secondary | ICD-10-CM | POA: Diagnosis not present

## 2020-04-02 MED ORDER — CLONAZEPAM 0.5 MG PO TABS: ORAL_TABLET | ORAL | 0 refills | Status: DC

## 2020-04-05 MED ORDER — CLONAZEPAM 0.5 MG PO TABS: ORAL_TABLET | ORAL | 0 refills | Status: DC

## 2020-04-05 NOTE — Addendum Note (Signed)
Addended bySilvio Pate on: 04/05/2020 03:31 PM   Modules accepted: Orders

## 2020-04-05 NOTE — Telephone Encounter (Signed)
They needed max daily amount on RX. Please resend.

## 2020-04-06 DIAGNOSIS — F331 Major depressive disorder, recurrent, moderate: Secondary | ICD-10-CM | POA: Diagnosis not present

## 2020-04-12 DIAGNOSIS — F331 Major depressive disorder, recurrent, moderate: Secondary | ICD-10-CM | POA: Diagnosis not present

## 2020-04-19 DIAGNOSIS — F331 Major depressive disorder, recurrent, moderate: Secondary | ICD-10-CM | POA: Diagnosis not present

## 2020-04-27 DIAGNOSIS — F331 Major depressive disorder, recurrent, moderate: Secondary | ICD-10-CM | POA: Diagnosis not present

## 2020-05-12 DIAGNOSIS — F331 Major depressive disorder, recurrent, moderate: Secondary | ICD-10-CM | POA: Diagnosis not present

## 2020-05-23 ENCOUNTER — Other Ambulatory Visit: Payer: Self-pay | Admitting: Physician Assistant

## 2020-05-23 DIAGNOSIS — R632 Polyphagia: Secondary | ICD-10-CM

## 2020-05-27 DIAGNOSIS — J029 Acute pharyngitis, unspecified: Secondary | ICD-10-CM | POA: Diagnosis not present

## 2020-05-28 ENCOUNTER — Encounter: Payer: Self-pay | Admitting: Physician Assistant

## 2020-05-28 DIAGNOSIS — R632 Polyphagia: Secondary | ICD-10-CM

## 2020-05-31 MED ORDER — BUPROPION HCL ER (XL) 150 MG PO TB24: 150.0000 mg | ORAL_TABLET | Freq: Every day | ORAL | 0 refills | Status: DC

## 2020-06-02 DIAGNOSIS — F331 Major depressive disorder, recurrent, moderate: Secondary | ICD-10-CM | POA: Diagnosis not present

## 2020-06-09 ENCOUNTER — Other Ambulatory Visit: Payer: Self-pay | Admitting: Physician Assistant

## 2020-06-09 DIAGNOSIS — F410 Panic disorder [episodic paroxysmal anxiety] without agoraphobia: Secondary | ICD-10-CM

## 2020-06-09 DIAGNOSIS — F39 Unspecified mood [affective] disorder: Secondary | ICD-10-CM

## 2020-06-09 DIAGNOSIS — F331 Major depressive disorder, recurrent, moderate: Secondary | ICD-10-CM

## 2020-06-09 DIAGNOSIS — F411 Generalized anxiety disorder: Secondary | ICD-10-CM

## 2020-06-23 ENCOUNTER — Other Ambulatory Visit: Payer: Self-pay | Admitting: Physician Assistant

## 2020-06-23 DIAGNOSIS — R632 Polyphagia: Secondary | ICD-10-CM

## 2020-06-30 DIAGNOSIS — F331 Major depressive disorder, recurrent, moderate: Secondary | ICD-10-CM | POA: Diagnosis not present

## 2020-07-01 ENCOUNTER — Other Ambulatory Visit: Payer: Self-pay | Admitting: Physician Assistant

## 2020-07-01 DIAGNOSIS — F410 Panic disorder [episodic paroxysmal anxiety] without agoraphobia: Secondary | ICD-10-CM

## 2020-07-01 DIAGNOSIS — F411 Generalized anxiety disorder: Secondary | ICD-10-CM

## 2020-07-01 DIAGNOSIS — F331 Major depressive disorder, recurrent, moderate: Secondary | ICD-10-CM

## 2020-07-01 DIAGNOSIS — F39 Unspecified mood [affective] disorder: Secondary | ICD-10-CM

## 2020-07-04 ENCOUNTER — Encounter: Payer: Self-pay | Admitting: Physician Assistant

## 2020-07-07 DIAGNOSIS — F331 Major depressive disorder, recurrent, moderate: Secondary | ICD-10-CM | POA: Diagnosis not present

## 2020-07-16 ENCOUNTER — Encounter: Payer: Self-pay | Admitting: Physician Assistant

## 2020-07-16 ENCOUNTER — Telehealth (INDEPENDENT_AMBULATORY_CARE_PROVIDER_SITE_OTHER): Payer: BC Managed Care – PPO | Admitting: Physician Assistant

## 2020-07-16 VITALS — Ht <= 58 in | Wt 140.0 lb

## 2020-07-16 DIAGNOSIS — F411 Generalized anxiety disorder: Secondary | ICD-10-CM

## 2020-07-16 DIAGNOSIS — F410 Panic disorder [episodic paroxysmal anxiety] without agoraphobia: Secondary | ICD-10-CM

## 2020-07-16 DIAGNOSIS — R632 Polyphagia: Secondary | ICD-10-CM

## 2020-07-16 DIAGNOSIS — F603 Borderline personality disorder: Secondary | ICD-10-CM | POA: Insufficient documentation

## 2020-07-16 DIAGNOSIS — F39 Unspecified mood [affective] disorder: Secondary | ICD-10-CM | POA: Diagnosis not present

## 2020-07-16 DIAGNOSIS — F331 Major depressive disorder, recurrent, moderate: Secondary | ICD-10-CM | POA: Diagnosis not present

## 2020-07-16 MED ORDER — BUPROPION HCL ER (XL) 150 MG PO TB24: 150.0000 mg | ORAL_TABLET | Freq: Every day | ORAL | 1 refills | Status: DC

## 2020-07-16 MED ORDER — CITALOPRAM HYDROBROMIDE 40 MG PO TABS: 40.0000 mg | ORAL_TABLET | Freq: Every day | ORAL | 1 refills | Status: DC

## 2020-07-16 MED ORDER — LAMOTRIGINE 100 MG PO TABS: 100.0000 mg | ORAL_TABLET | Freq: Two times a day (BID) | ORAL | 0 refills | Status: DC

## 2020-07-16 NOTE — Progress Notes (Signed)
Needs refills on mood medications Recent visit with therapist - discussed possible Borderline personality disorder and she wants to discuss possible medications  PHQ9 (24) -GAD7 (19) completed.

## 2020-07-16 NOTE — Progress Notes (Signed)
Patient ID: Lauren Zamora, female   DOB: [birth year], 20 y.o.   MRN: [id number] .Marland KitchenVirtual Visit via Video Note  I connected with Tu Gilliand on 07/16/20 at  1:00 PM EST by a video enabled telemedicine application and verified that I am speaking with the correct person using two identifiers.  Location: Patient: home Provider: clinic  .Marland KitchenParticipating in visit:  Patient: Neidy Provider: Tandy Gaw PA-C   I discussed the limitations of evaluation and management by telemedicine and the availability of in person appointments. The patient expressed understanding and agreed to proceed.  History of Present Illness: Pt is a 20 yo female with mood disorder, GAD, MDD who presents to the clinic for medication refill.   She has been on same medication regimen since June 2021 and was doing well until 2 months ago. She is in counseling which has helped a lot. She goes to the [facility]. She was recently told that she has borderline personality disorder. She feels like work stress has triggered a worsening of mood. She is thinking more thoughts of self harm and "why is she here". She has made no plans to hurt herself. She feels like medications are not working as good as previously were and wants adjustment. She has started back binge eating which was improving.   .. Active Ambulatory Problems    Diagnosis Date Noted  . GAD (generalized anxiety disorder) 08/11/2015  . Asthma, mild intermittent 08/11/2015  . Non-seasonal allergic rhinitis 11/30/2016  . Dysmenorrhea in adolescent 12/13/2016  . Menorrhagia with regular cycle 12/13/2016  . Low folate 12/19/2016  . Persistent headaches 07/06/2017  . Post concussion syndrome 07/06/2017  . Panic attacks 10/02/2017  . Weight gain 10/02/2017  . No energy 10/02/2017  . Folic acid deficiency 10/03/2017  . Moderate episode of recurrent major depressive disorder (HCC) 04/16/2019  . Mood disorder (HCC) 04/16/2019  . Deliberate  self-cutting 04/16/2019  . Chest pain 07/07/2019  . Binge eating 01/04/2020  . Borderline personality disorder (HCC) 07/16/2020   Resolved Ambulatory Problems    Diagnosis Date Noted  . ASTHMA, ACUTE 01/07/2009  . Left cervical lymphadenopathy 10/15/2015  . Pneumonitis 11/20/2016   Past Medical History:  Diagnosis Date  . Asthma    Reviewed med, allergy, problem list.    Observations/Objective: No acute distress Normal mood and appearance Normal breathing  .Marland Kitchen Today's Vitals   07/16/20 1136  Weight: 140 lb (63.5 kg)  Height: 4\' 8"  (1.422 m)   Body mass index is 31.39 kg/m.   .. Depression screen [facility] 2/9 07/16/2020 12/31/2019 12/03/2019 04/15/2019 12/18/2018  Decreased Interest 3 3 3 3 1   Down, Depressed, Hopeless 3 3 3 2 2   PHQ - 2 Score 6 6 6 5 3   Altered sleeping 3 3 3 3 3   Tired, decreased energy 3 3 3 3 3   Change in appetite 3 3 3 2 2   Feeling bad or failure about yourself  3 2 3 2 1   Trouble concentrating 2 3 3 3 1   Moving slowly or fidgety/restless 2 3 2  0 0  Suicidal thoughts 2 1 1 1  0  PHQ-9 Score 24 24 24 19 13   Difficult doing work/chores Extremely dIfficult Extremely dIfficult Very difficult Extremely dIfficult Somewhat difficult  Some recent data might be hidden   .12/20/2018 GAD 7 : Generalized Anxiety Score 07/16/2020 12/31/2019 12/03/2019 04/15/2019  Nervous, Anxious, on Edge 3 3 3 3   Control/stop worrying 3 3 3 3   Worry too much - different things 3 3  3 3  Trouble relaxing 3 3 3 3   Restless 1 3 3 2   Easily annoyed or irritable 3 3 3 3   Afraid - awful might happen 3 3 3  0  Total GAD 7 Score 19 21 21 17   Anxiety Difficulty Extremely difficult Extremely difficult Extremely difficult Extremely difficult     Assessment and Plan:  Diagnoses and all orders for this visit:  Mood disorder (HCC) -     citalopram (CELEXA) 40 MG tablet; Take 1 tablet (40 mg total) by mouth daily. -     lamoTRIgine (LAMICTAL) 100 MG tablet; Take 1 tablet (100 mg total) by mouth 2  (two) times daily. -     Ambulatory referral to Psychiatry  GAD (generalized anxiety disorder) -     citalopram (CELEXA) 40 MG tablet; Take 1 tablet (40 mg total) by mouth daily. -     buPROPion (WELLBUTRIN XL) 150 MG 24 hr tablet; Take 1 tablet (150 mg total) by mouth daily. -     lamoTRIgine (LAMICTAL) 100 MG tablet; Take 1 tablet (100 mg total) by mouth 2 (two) times daily. -     Ambulatory referral to Psychiatry  Moderate episode of recurrent major depressive disorder (HCC) -     citalopram (CELEXA) 40 MG tablet; Take 1 tablet (40 mg total) by mouth daily. -     buPROPion (WELLBUTRIN XL) 150 MG 24 hr tablet; Take 1 tablet (150 mg total) by mouth daily. -     lamoTRIgine (LAMICTAL) 100 MG tablet; Take 1 tablet (100 mg total) by mouth 2 (two) times daily. -     Ambulatory referral to Psychiatry  Panic attacks -     citalopram (CELEXA) 40 MG tablet; Take 1 tablet (40 mg total) by mouth daily. -     Ambulatory referral to Psychiatry  Binge eating -     buPROPion (WELLBUTRIN XL) 150 MG 24 hr tablet; Take 1 tablet (150 mg total) by mouth daily. -     Ambulatory referral to Psychiatry  Borderline personality disorder (HCC) -     lamoTRIgine (LAMICTAL) 100 MG tablet; Take 1 tablet (100 mg total) by mouth 2 (two) times daily. -     Ambulatory referral to Psychiatry   Pt has benefited greatly from counseling. Continue counseling. PHQ/GAD are not controlled.  I do think we need to get a psychiatrist on board. Will make referral. Increased lamictal to 100mg  bid. Continue on all the same medications. Follow up with any sudden worsening symptoms.     Follow Up Instructions:    I discussed the assessment and treatment plan with the patient. The patient was provided an opportunity to ask questions and all were answered. The patient agreed with the plan and demonstrated an understanding of the instructions.   The patient was advised to call back or seek an in-person evaluation if the symptoms  worsen or if the condition fails to improve as anticipated.   , PA-C

## 2020-07-22 DIAGNOSIS — F331 Major depressive disorder, recurrent, moderate: Secondary | ICD-10-CM | POA: Diagnosis not present

## 2020-07-30 ENCOUNTER — Other Ambulatory Visit: Payer: Self-pay | Admitting: Physician Assistant

## 2020-07-30 DIAGNOSIS — N946 Dysmenorrhea, unspecified: Secondary | ICD-10-CM

## 2020-08-04 DIAGNOSIS — F331 Major depressive disorder, recurrent, moderate: Secondary | ICD-10-CM | POA: Diagnosis not present

## 2020-08-11 DIAGNOSIS — F331 Major depressive disorder, recurrent, moderate: Secondary | ICD-10-CM | POA: Diagnosis not present

## 2020-08-18 DIAGNOSIS — F331 Major depressive disorder, recurrent, moderate: Secondary | ICD-10-CM | POA: Diagnosis not present

## 2020-08-25 DIAGNOSIS — F331 Major depressive disorder, recurrent, moderate: Secondary | ICD-10-CM | POA: Diagnosis not present

## 2020-09-02 DIAGNOSIS — F331 Major depressive disorder, recurrent, moderate: Secondary | ICD-10-CM | POA: Diagnosis not present

## 2020-09-05 ENCOUNTER — Encounter: Payer: Self-pay | Admitting: Physician Assistant

## 2020-09-05 ENCOUNTER — Other Ambulatory Visit: Payer: Self-pay | Admitting: Physician Assistant

## 2020-09-05 DIAGNOSIS — F411 Generalized anxiety disorder: Secondary | ICD-10-CM

## 2020-09-05 DIAGNOSIS — F39 Unspecified mood [affective] disorder: Secondary | ICD-10-CM

## 2020-09-05 DIAGNOSIS — F603 Borderline personality disorder: Secondary | ICD-10-CM

## 2020-09-05 DIAGNOSIS — F331 Major depressive disorder, recurrent, moderate: Secondary | ICD-10-CM

## 2020-09-05 DIAGNOSIS — F410 Panic disorder [episodic paroxysmal anxiety] without agoraphobia: Secondary | ICD-10-CM

## 2020-09-06 MED ORDER — CLONAZEPAM 0.5 MG PO TABS: ORAL_TABLET | ORAL | 0 refills | Status: DC

## 2020-09-06 MED ORDER — LAMOTRIGINE 100 MG PO TABS: 100.0000 mg | ORAL_TABLET | Freq: Two times a day (BID) | ORAL | 0 refills | Status: DC

## 2020-09-08 DIAGNOSIS — F331 Major depressive disorder, recurrent, moderate: Secondary | ICD-10-CM | POA: Diagnosis not present

## 2020-09-15 DIAGNOSIS — F331 Major depressive disorder, recurrent, moderate: Secondary | ICD-10-CM | POA: Diagnosis not present

## 2020-10-05 DIAGNOSIS — F331 Major depressive disorder, recurrent, moderate: Secondary | ICD-10-CM | POA: Diagnosis not present

## 2020-10-14 DIAGNOSIS — F331 Major depressive disorder, recurrent, moderate: Secondary | ICD-10-CM | POA: Diagnosis not present

## 2020-10-21 DIAGNOSIS — F331 Major depressive disorder, recurrent, moderate: Secondary | ICD-10-CM | POA: Diagnosis not present

## 2020-10-27 DIAGNOSIS — F331 Major depressive disorder, recurrent, moderate: Secondary | ICD-10-CM | POA: Diagnosis not present

## 2020-11-03 DIAGNOSIS — F331 Major depressive disorder, recurrent, moderate: Secondary | ICD-10-CM | POA: Diagnosis not present

## 2020-11-07 ENCOUNTER — Telehealth: Payer: BC Managed Care – PPO | Admitting: Nurse Practitioner

## 2020-11-07 DIAGNOSIS — R059 Cough, unspecified: Secondary | ICD-10-CM

## 2020-11-07 DIAGNOSIS — R058 Other specified cough: Secondary | ICD-10-CM

## 2020-11-07 DIAGNOSIS — Z20822 Contact with and (suspected) exposure to covid-19: Secondary | ICD-10-CM

## 2020-11-07 MED ORDER — ALBUTEROL SULFATE HFA 108 (90 BASE) MCG/ACT IN AERS: 2.0000 | INHALATION_SPRAY | Freq: Four times a day (QID) | RESPIRATORY_TRACT | 0 refills | Status: AC | PRN

## 2020-11-07 MED ORDER — FLUTICASONE PROPIONATE 50 MCG/ACT NA SUSP: 2.0000 | Freq: Every day | NASAL | 0 refills | Status: DC

## 2020-11-07 NOTE — Progress Notes (Signed)
E-Visit for Corona Virus Screening  Your current symptoms could be consistent with the coronavirus.  Many health care providers can now test patients at their office but not all are.  [place] has multiple testing sites. For information on our COVID testing locations and hours go to [email or web address]  We are enrolling you in our MyChart Home Monitoring for COVID19 . Daily you will receive a questionnaire within the MyChart website. Our COVID 19 response team will be monitoring your responses daily.  Testing Information: The COVID-19 Community Testing sites are testing BY APPOINTMENT ONLY.  You can schedule online at [email or web address]  If you do not have access to a smart phone or computer you may call [phone] for an appointment.   Additional testing sites in the Community:  For [facility] Testing sites in [place]  [email or web address]  For Pop-up testing sites in [place]  [email or web address]  For [email or web address]  For [facility] testing in [place] and [email or web address]  For [facility] testing in [place]   [email or web address]  For  more information about community testing call [phone]   Please quarantine yourself while awaiting your test results. Please stay home for a minimum of 10 days from the first day of illness with improving symptoms and you have had 24 hours of no fever (without the use of Tylenol (Acetaminophen) Motrin (Ibuprofen) or any fever reducing medication).  Also - Do not get tested prior to returning to work because once you have had a positive test the test can stay positive for  more than a month in some cases.   You should wear a mask or cloth face covering over your nose and mouth if you must be around other people or animals, including pets (even at home). Try to stay at least 6 feet away from other people. This will protect the people around you.  Please continue good preventive care measures, including:  frequent hand-washing, avoid touching your face, cover coughs/sneezes, stay out of crowds and keep a 6 foot distance from others.  COVID-19 is a respiratory illness with symptoms that are similar to the flu. Symptoms are typically mild to moderate, but there have been cases of severe illness and death due to the virus.   The following symptoms may appear 2-14 days after exposure: Fever Cough Shortness of breath or difficulty breathing Chills Repeated shaking with chills Muscle pain Headache Sore throat New loss of taste or smell Fatigue Congestion or runny nose Nausea or vomiting Diarrhea  Go to the nearest hospital ED for assessment if fever/cough/breathlessness are severe or illness seems like a threat to life.  It is vitally important that if you feel that you have an infection such as this virus or any other virus that you stay home and away from places where you may spread it to others.  You should avoid contact with people age 67 and older.   You can use medication such as  prescription inhaler called Albuterol MDI 90 mcg /actuation 2 puffs every 4 hours as needed for shortness of breath, wheezing, cough and prescription for Fluticasone nasal spray 2 sprays in each nostril one time per day  You may also take acetaminophen (Tylenol) as needed for fever.  Reduce your risk of any infection by using the same precautions used for avoiding the common cold or flu:  Wash your hands often with soap and warm water for at least 20 seconds.  If soap and water are not readily available, use  an alcohol-based hand sanitizer with at least 60% alcohol.  If coughing or  sneezing, cover your mouth and nose by coughing or sneezing into the elbow areas of your shirt or coat, into a tissue or into your sleeve (not your hands). Avoid shaking hands with others and consider head nods or verbal greetings only. Avoid touching your eyes, nose, or mouth with unwashed hands.  Avoid close contact with people who are sick. Avoid places or events with large numbers of people in one location, like concerts or sporting events. Carefully consider travel plans you have or are making. If you are planning any travel outside or inside the [place], visit the CDC's Travelers' Health webpage for the latest health notices. If you have some symptoms but not all symptoms, continue to monitor at home and seek medical attention if your symptoms worsen. If you are having a medical emergency, call 911.  HOME CARE Only take medications as instructed by your medical team. Drink plenty of fluids and get plenty of rest. A steam or ultrasonic humidifier can help if you have congestion.   GET HELP RIGHT AWAY IF YOU HAVE EMERGENCY WARNING SIGNS** FOR COVID-19. If you or someone is showing any of these signs seek emergency medical care immediately. Call 911 or proceed to your closest emergency facility if: You develop worsening high fever. Trouble breathing Bluish lips or face Persistent pain or pressure in the chest New confusion Inability to wake or stay awake You cough up blood. Your symptoms become more severe  **This list is not all possible symptoms. Contact your medical provider for any symptoms that are sever or concerning to you.  MAKE SURE YOU  Understand these instructions. Will watch your condition. Will get help right away if you are not doing well or get worse.  Your e-visit answers were reviewed by a board certified advanced clinical practitioner to complete your personal care plan.  Depending on the condition, your plan could have included both over the counter or prescription  medications.  If there is a problem please reply once you have received a response from your provider.  Your safety is important to [place].  If you have drug allergies check your prescription carefully.    You can use MyChart to ask questions about today's visit, request a non-urgent call back, or ask for a work or school excuse for 24 hours related to this e-Visit. If it has been greater than 24 hours you will need to follow up with your provider, or enter a new e-Visit to address those concerns. You will get an e-mail in the next two days asking about your experience.  I hope that your e-visit has been valuable and will speed your recovery. Thank you for using e-visits.  I have spent at least 5 minutes reviewing and documenting in the patient's chart.

## 2020-11-08 DIAGNOSIS — J029 Acute pharyngitis, unspecified: Secondary | ICD-10-CM | POA: Diagnosis not present

## 2020-11-08 DIAGNOSIS — R059 Cough, unspecified: Secondary | ICD-10-CM | POA: Diagnosis not present

## 2020-11-08 DIAGNOSIS — J019 Acute sinusitis, unspecified: Secondary | ICD-10-CM | POA: Diagnosis not present

## 2020-11-08 DIAGNOSIS — Z20822 Contact with and (suspected) exposure to covid-19: Secondary | ICD-10-CM | POA: Diagnosis not present

## 2020-11-08 DIAGNOSIS — F331 Major depressive disorder, recurrent, moderate: Secondary | ICD-10-CM | POA: Diagnosis not present

## 2020-11-16 DIAGNOSIS — F331 Major depressive disorder, recurrent, moderate: Secondary | ICD-10-CM | POA: Diagnosis not present

## 2020-12-02 DIAGNOSIS — F331 Major depressive disorder, recurrent, moderate: Secondary | ICD-10-CM | POA: Diagnosis not present

## 2020-12-07 DIAGNOSIS — F331 Major depressive disorder, recurrent, moderate: Secondary | ICD-10-CM | POA: Diagnosis not present

## 2020-12-14 DIAGNOSIS — F331 Major depressive disorder, recurrent, moderate: Secondary | ICD-10-CM | POA: Diagnosis not present

## 2020-12-21 DIAGNOSIS — F331 Major depressive disorder, recurrent, moderate: Secondary | ICD-10-CM | POA: Diagnosis not present

## 2020-12-27 DIAGNOSIS — F331 Major depressive disorder, recurrent, moderate: Secondary | ICD-10-CM | POA: Diagnosis not present

## 2021-01-04 DIAGNOSIS — F331 Major depressive disorder, recurrent, moderate: Secondary | ICD-10-CM | POA: Diagnosis not present

## 2021-01-08 ENCOUNTER — Other Ambulatory Visit: Payer: Self-pay | Admitting: Physician Assistant

## 2021-01-08 DIAGNOSIS — F603 Borderline personality disorder: Secondary | ICD-10-CM

## 2021-01-08 DIAGNOSIS — F411 Generalized anxiety disorder: Secondary | ICD-10-CM

## 2021-01-08 DIAGNOSIS — F39 Unspecified mood [affective] disorder: Secondary | ICD-10-CM

## 2021-01-08 DIAGNOSIS — F331 Major depressive disorder, recurrent, moderate: Secondary | ICD-10-CM

## 2021-01-11 DIAGNOSIS — F331 Major depressive disorder, recurrent, moderate: Secondary | ICD-10-CM | POA: Diagnosis not present

## 2021-01-18 DIAGNOSIS — F331 Major depressive disorder, recurrent, moderate: Secondary | ICD-10-CM | POA: Diagnosis not present

## 2021-01-25 DIAGNOSIS — F331 Major depressive disorder, recurrent, moderate: Secondary | ICD-10-CM | POA: Diagnosis not present

## 2021-02-01 DIAGNOSIS — F331 Major depressive disorder, recurrent, moderate: Secondary | ICD-10-CM | POA: Diagnosis not present

## 2021-02-08 DIAGNOSIS — F331 Major depressive disorder, recurrent, moderate: Secondary | ICD-10-CM | POA: Diagnosis not present

## 2021-02-09 DIAGNOSIS — F331 Major depressive disorder, recurrent, moderate: Secondary | ICD-10-CM | POA: Diagnosis not present

## 2021-02-15 DIAGNOSIS — F331 Major depressive disorder, recurrent, moderate: Secondary | ICD-10-CM | POA: Diagnosis not present

## 2021-02-22 DIAGNOSIS — F331 Major depressive disorder, recurrent, moderate: Secondary | ICD-10-CM | POA: Diagnosis not present

## 2021-03-01 DIAGNOSIS — F331 Major depressive disorder, recurrent, moderate: Secondary | ICD-10-CM | POA: Diagnosis not present

## 2021-03-08 DIAGNOSIS — F331 Major depressive disorder, recurrent, moderate: Secondary | ICD-10-CM | POA: Diagnosis not present

## 2021-03-15 DIAGNOSIS — F331 Major depressive disorder, recurrent, moderate: Secondary | ICD-10-CM | POA: Diagnosis not present

## 2021-03-22 DIAGNOSIS — F331 Major depressive disorder, recurrent, moderate: Secondary | ICD-10-CM | POA: Diagnosis not present

## 2021-03-29 DIAGNOSIS — F331 Major depressive disorder, recurrent, moderate: Secondary | ICD-10-CM | POA: Diagnosis not present

## 2021-04-06 DIAGNOSIS — F331 Major depressive disorder, recurrent, moderate: Secondary | ICD-10-CM | POA: Diagnosis not present

## 2021-04-09 ENCOUNTER — Other Ambulatory Visit: Payer: Self-pay | Admitting: Physician Assistant

## 2021-04-09 DIAGNOSIS — F39 Unspecified mood [affective] disorder: Secondary | ICD-10-CM

## 2021-04-09 DIAGNOSIS — F331 Major depressive disorder, recurrent, moderate: Secondary | ICD-10-CM

## 2021-04-09 DIAGNOSIS — F603 Borderline personality disorder: Secondary | ICD-10-CM

## 2021-04-09 DIAGNOSIS — F411 Generalized anxiety disorder: Secondary | ICD-10-CM

## 2021-04-12 DIAGNOSIS — F331 Major depressive disorder, recurrent, moderate: Secondary | ICD-10-CM | POA: Diagnosis not present

## 2021-04-19 DIAGNOSIS — F331 Major depressive disorder, recurrent, moderate: Secondary | ICD-10-CM | POA: Diagnosis not present

## 2021-04-27 DIAGNOSIS — F331 Major depressive disorder, recurrent, moderate: Secondary | ICD-10-CM | POA: Diagnosis not present

## 2021-05-05 ENCOUNTER — Other Ambulatory Visit: Payer: Self-pay | Admitting: Physician Assistant

## 2021-05-05 DIAGNOSIS — F411 Generalized anxiety disorder: Secondary | ICD-10-CM

## 2021-05-05 DIAGNOSIS — F331 Major depressive disorder, recurrent, moderate: Secondary | ICD-10-CM | POA: Diagnosis not present

## 2021-05-05 DIAGNOSIS — F603 Borderline personality disorder: Secondary | ICD-10-CM

## 2021-05-05 DIAGNOSIS — F39 Unspecified mood [affective] disorder: Secondary | ICD-10-CM

## 2021-05-10 DIAGNOSIS — F331 Major depressive disorder, recurrent, moderate: Secondary | ICD-10-CM | POA: Diagnosis not present

## 2021-05-17 DIAGNOSIS — F331 Major depressive disorder, recurrent, moderate: Secondary | ICD-10-CM | POA: Diagnosis not present

## 2021-05-24 DIAGNOSIS — F331 Major depressive disorder, recurrent, moderate: Secondary | ICD-10-CM | POA: Diagnosis not present

## 2021-05-27 ENCOUNTER — Other Ambulatory Visit: Payer: Self-pay | Admitting: Physician Assistant

## 2021-05-27 DIAGNOSIS — J301 Allergic rhinitis due to pollen: Secondary | ICD-10-CM

## 2021-06-02 DIAGNOSIS — F331 Major depressive disorder, recurrent, moderate: Secondary | ICD-10-CM | POA: Diagnosis not present

## 2021-06-07 DIAGNOSIS — F331 Major depressive disorder, recurrent, moderate: Secondary | ICD-10-CM | POA: Diagnosis not present

## 2021-06-14 DIAGNOSIS — F331 Major depressive disorder, recurrent, moderate: Secondary | ICD-10-CM | POA: Diagnosis not present

## 2021-06-23 DIAGNOSIS — F331 Major depressive disorder, recurrent, moderate: Secondary | ICD-10-CM | POA: Diagnosis not present

## 2021-06-26 ENCOUNTER — Other Ambulatory Visit: Payer: Self-pay | Admitting: Physician Assistant

## 2021-06-26 DIAGNOSIS — N946 Dysmenorrhea, unspecified: Secondary | ICD-10-CM

## 2021-06-30 DIAGNOSIS — F331 Major depressive disorder, recurrent, moderate: Secondary | ICD-10-CM | POA: Diagnosis not present

## 2021-07-05 DIAGNOSIS — F331 Major depressive disorder, recurrent, moderate: Secondary | ICD-10-CM | POA: Diagnosis not present

## 2021-07-13 DIAGNOSIS — F331 Major depressive disorder, recurrent, moderate: Secondary | ICD-10-CM | POA: Diagnosis not present

## 2021-07-18 DIAGNOSIS — F331 Major depressive disorder, recurrent, moderate: Secondary | ICD-10-CM | POA: Diagnosis not present

## 2021-07-21 ENCOUNTER — Other Ambulatory Visit: Payer: Self-pay | Admitting: Physician Assistant

## 2021-07-21 DIAGNOSIS — N946 Dysmenorrhea, unspecified: Secondary | ICD-10-CM

## 2021-07-26 DIAGNOSIS — F331 Major depressive disorder, recurrent, moderate: Secondary | ICD-10-CM | POA: Diagnosis not present

## 2021-08-02 DIAGNOSIS — F331 Major depressive disorder, recurrent, moderate: Secondary | ICD-10-CM | POA: Diagnosis not present

## 2021-08-09 DIAGNOSIS — F331 Major depressive disorder, recurrent, moderate: Secondary | ICD-10-CM | POA: Diagnosis not present

## 2021-08-12 ENCOUNTER — Other Ambulatory Visit: Payer: Self-pay

## 2021-08-12 ENCOUNTER — Ambulatory Visit: Payer: BC Managed Care – PPO | Admitting: Physician Assistant

## 2021-08-12 VITALS — BP 124/76 | HR 90 | Ht <= 58 in | Wt 139.0 lb

## 2021-08-12 DIAGNOSIS — Z131 Encounter for screening for diabetes mellitus: Secondary | ICD-10-CM

## 2021-08-12 DIAGNOSIS — F331 Major depressive disorder, recurrent, moderate: Secondary | ICD-10-CM

## 2021-08-12 DIAGNOSIS — R790 Abnormal level of blood mineral: Secondary | ICD-10-CM | POA: Diagnosis not present

## 2021-08-12 DIAGNOSIS — Z13220 Encounter for screening for lipoid disorders: Secondary | ICD-10-CM

## 2021-08-12 DIAGNOSIS — F411 Generalized anxiety disorder: Secondary | ICD-10-CM

## 2021-08-12 DIAGNOSIS — F410 Panic disorder [episodic paroxysmal anxiety] without agoraphobia: Secondary | ICD-10-CM

## 2021-08-12 DIAGNOSIS — R632 Polyphagia: Secondary | ICD-10-CM

## 2021-08-12 DIAGNOSIS — E538 Deficiency of other specified B group vitamins: Secondary | ICD-10-CM

## 2021-08-12 DIAGNOSIS — N920 Excessive and frequent menstruation with regular cycle: Secondary | ICD-10-CM

## 2021-08-12 DIAGNOSIS — F39 Unspecified mood [affective] disorder: Secondary | ICD-10-CM

## 2021-08-12 DIAGNOSIS — Z23 Encounter for immunization: Secondary | ICD-10-CM | POA: Diagnosis not present

## 2021-08-12 DIAGNOSIS — Z1329 Encounter for screening for other suspected endocrine disorder: Secondary | ICD-10-CM

## 2021-08-12 MED ORDER — CITALOPRAM HYDROBROMIDE 40 MG PO TABS: 40.0000 mg | ORAL_TABLET | Freq: Every day | ORAL | 3 refills | Status: DC

## 2021-08-12 MED ORDER — BUPROPION HCL ER (XL) 150 MG PO TB24: 150.0000 mg | ORAL_TABLET | Freq: Every day | ORAL | 3 refills | Status: DC

## 2021-08-12 MED ORDER — NORETHIN ACE-ETH ESTRAD-FE 1.5-30 MG-MCG PO TABS: 1.0000 | ORAL_TABLET | Freq: Every day | ORAL | 4 refills | Status: DC

## 2021-08-12 MED ORDER — LAMOTRIGINE 100 MG PO TABS: 100.0000 mg | ORAL_TABLET | Freq: Every day | ORAL | 3 refills | Status: DC

## 2021-08-12 NOTE — Patient Instructions (Signed)
Premenstrual Syndrome Premenstrual syndrome (PMS) is a group of physical, emotional, and behavioral symptoms that affect women as part of their menstrual cycle. PMS occurs 1-2 weeks before the start of a woman's menstrual period and goes away a few daysafter menstrual bleeding begins. PMS can range from mild to severe. What are the causes? The exact cause of this condition is not known, but it seems to be related tohormone changes that happen before menstruation. What are the signs or symptoms? Symptoms of this condition often happen every month. They go away after your period starts. Physical symptoms of this condition include: Bloating. Breast pain or tenderness. Headaches. Extreme fatigue. Backaches. Swelling of the hands and feet. Weight gain. Hot flashes. Emotional symptoms of this condition include: Mood swings. Depression. Angry or hostile outbursts. Irritability. Anxiety. Crying spells. Behavioral symptoms include: Food cravings or appetite changes. Changes in sexual desire. Confusion. Social withdrawal. Poor concentration. How is this diagnosed? This condition may be diagnosed based on a history of your symptoms. This condition is generally diagnosed if symptoms of PMS: Are present in the 5 days before your period starts. End within 4 days after your period starts. Happen at least 3 months in a row. Interfere with some of your normal activities. Other conditions that can cause some of these symptoms must be ruled out before PMS can be diagnosed. These include depression, anxiety, anemia, and thyroidproblems. How is this treated? This condition may be treated by doing the following: Maintaining a healthy lifestyle. This includes eating a well-balanced diet and exercising regularly. Taking over-the-counter medicines that can help relieve symptoms, such as cramps, aches, pain, headaches, and breast tenderness. Follow these instructions at home: Eating and drinking  Eat  a well-balanced diet. Avoid caffeine and alcohol. Limit the amount of salt and salty foods you eat. This will help reduce bloating. Drink enough fluid to keep your urine pale yellow. Take a multivitamin if told to do so by your health care provider.  Lifestyle  Do not use any products that contain nicotine or tobacco. These products include cigarettes, chewing tobacco, and vaping devices, such as e-cigarettes. If you need help quitting, ask your health care provider. Exercise regularly as suggested by your health care provider. Get enough sleep. For most adults, this is 7-8 hours of sleep each night. Practice relaxation techniques, such as yoga, tai chi, or meditation. Find healthy ways to manage stress.  General instructions  For 2-3 months, write down your symptoms, whether they are mild to severe, and how long they last. This will help your health care provider choose the best treatment for you. Take over-the-counter and prescription medicines only as told by your health care provider. If you are using birth control pills (oral contraceptives), use them as told by your health care provider.  Contact a health care provider if: Your symptoms get worse. You develop new symptoms. You have trouble doing your daily activities. Summary Premenstrual syndrome (PMS) is a group of physical, emotional, and behavioral symptoms that affect women as part of their menstrual cycle. PMS starts 1-2 weeks before the start of a woman's period and goes away a few days after the period starts. PMS is treated by maintaining a healthy lifestyle and taking medicines to relieve the symptoms. This information is not intended to replace advice given to you by your health care provider. Make sure you discuss any questions you have with your healthcare provider. Document Revised: 01/02/2020 Document Reviewed: 01/02/2020 Elsevier Patient Education  2022 [employer]  

## 2021-08-12 NOTE — Progress Notes (Signed)
? ?Subjective:  ? ? Patient ID: Lauren Zamora, female    DOB: 2000/11/13, 21 y.o.   MRN: [id number] ? ?HPI ? ?Pt is a 21 yo female with asthma, GAD, MDD, Borderline personality disorder who presents to the clinic to get back on medication.  ? ?She stopped all medication because she was having problems filling it at pharmacy.she has been out for 3 months.  She is now very tearful, upset, irritable, and moody. She would like to start back on medication. She was doing much better. She notices more mood changes around her cycle. Her cycles are also very heavy but regular. She would like to go back on birth control.  ? ? ?Active Ambulatory Problems  ?  Diagnosis Date Noted  ? GAD (generalized anxiety disorder) 08/11/2015  ? Asthma, mild intermittent 08/11/2015  ? Non-seasonal allergic rhinitis 11/30/2016  ? Dysmenorrhea in adolescent 12/13/2016  ? Menorrhagia with regular cycle 12/13/2016  ? Low folate 12/19/2016  ? Persistent headaches 07/06/2017  ? Post concussion syndrome 07/06/2017  ? Panic attacks 10/02/2017  ? Weight gain 10/02/2017  ? No energy 10/02/2017  ? Folic acid deficiency [id number]  ? Moderate episode of recurrent major depressive disorder ([place]) 04/16/2019  ? Mood disorder ([place]) 04/16/2019  ? Deliberate self-cutting 04/16/2019  ? Chest pain 07/07/2019  ? Binge eating 01/04/2020  ? Borderline personality disorder ([place]) 07/16/2020  ? Low iron stores 08/15/2021  ? Low serum vitamin B12 08/15/2021  ? ?Resolved Ambulatory Problems  ?  Diagnosis Date Noted  ? ASTHMA, ACUTE 01/07/2009  ? Left cervical lymphadenopathy 10/15/2015  ? Pneumonitis 11/20/2016  ? ?Past Medical History:  ?Diagnosis Date  ? Asthma   ? ? ? ?Review of Systems ?See HPI.  ?   ?Objective:  ? Physical Exam ?Vitals reviewed.  ?Constitutional:   ?   Appearance: Normal appearance.  ?HENT:  ?   Head: Normocephalic.  ?Cardiovascular:  ?   Rate and Rhythm: Normal rate and regular rhythm.  ?   Pulses: Normal pulses.  ?   Heart sounds: Normal heart  sounds.  ?Pulmonary:  ?   Effort: Pulmonary effort is normal.  ?   Breath sounds: Normal breath sounds.  ?Neurological:  ?   General: No focal deficit present.  ?   Mental Status: She is alert and oriented to person, place, and time.  ?Psychiatric:     ?   Mood and Affect: Mood normal.  ? ? ? ?.. ?Depression screen [facility] 2/9 08/12/2021 07/16/2020 12/31/2019 12/03/2019 04/15/2019  ?Decreased Interest 2 3 3 3 3   ?Down, Depressed, Hopeless 3 3 3 3 2   ?PHQ - 2 Score 5 6 6 6 5   ?Altered sleeping 3 3 3 3 3   ?Tired, decreased energy 3 3 3 3 3   ?Change in appetite 3 3 3 3 2   ?Feeling bad or failure about yourself  3 3 2 3 2   ?Trouble concentrating 1 2 3 3 3   ?Moving slowly or fidgety/restless 3 2 3 2  0  ?Suicidal thoughts 0 2 1 1 1   ?PHQ-9 Score 21 24 24 24 19   ?Difficult doing work/chores Extremely dIfficult Extremely dIfficult Extremely dIfficult Very difficult Extremely dIfficult  ?Some recent data might be hidden  ? ?.. ?GAD 7 : Generalized Anxiety Score 08/12/2021 07/16/2020 12/31/2019 12/03/2019  ?Nervous, Anxious, on Edge 3 3 3 3   ?Control/stop worrying 3 3 3 3   ?Worry too much - different things 3 3 3 3   ?Trouble relaxing 3 3 3 3   ?  Restless 1 1 3 3   ?Easily annoyed or irritable 3 3 3 3   ?Afraid - awful might happen 3 3 3 3   ?Total GAD 7 Score 19 19 21 21   ?Anxiety Difficulty Extremely difficult Extremely difficult Extremely difficult Extremely difficult  ? ? ? ? ?   ?Assessment & Plan:  ?..Shaneen was seen today for menstrual problem. ? ?Diagnoses and all orders for this visit: ? ?Menorrhagia with regular cycle ?-     CBC with Differential/Platelet ?-     Fe+TIBC+Fer ?-     norethindrone-ethinyl estradiol-iron (JUNEL FE 1.5/30) 1.5-30 MG-MCG tablet; Take 1 tablet by mouth daily. ? ?Low iron stores ?-     CBC with Differential/Platelet ?-     Fe+TIBC+Fer ? ?Screening for lipid disorders ? ?Low serum vitamin B12 ?-     Vitamin B12 ? ?Thyroid disorder screen ?-     TSH ? ?Diabetes mellitus screening ?-     COMPLETE METABOLIC PANEL  WITH GFR ? ?Mood disorder (HCC) ?-     citalopram (CELEXA) 40 MG tablet; Take 1 tablet (40 mg total) by mouth daily. ?-     lamoTRIgine (LAMICTAL) 100 MG tablet; Take 1 tablet (100 mg total) by mouth daily. ? ?GAD (generalized anxiety disorder) ?-     citalopram (CELEXA) 40 MG tablet; Take 1 tablet (40 mg total) by mouth daily. ?-     buPROPion (WELLBUTRIN XL) 150 MG 24 hr tablet; Take 1 tablet (150 mg total) by mouth daily. ?-     lamoTRIgine (LAMICTAL) 100 MG tablet; Take 1 tablet (100 mg total) by mouth daily. ? ?Moderate episode of recurrent major depressive disorder (HCC) ?-     citalopram (CELEXA) 40 MG tablet; Take 1 tablet (40 mg total) by mouth daily. ?-     buPROPion (WELLBUTRIN XL) 150 MG 24 hr tablet; Take 1 tablet (150 mg total) by mouth daily. ?-     lamoTRIgine (LAMICTAL) 100 MG tablet; Take 1 tablet (100 mg total) by mouth daily. ? ?Panic attacks ?-     citalopram (CELEXA) 40 MG tablet; Take 1 tablet (40 mg total) by mouth daily. ? ?Binge eating ?-     buPROPion (WELLBUTRIN XL) 150 MG 24 hr tablet; Take 1 tablet (150 mg total) by mouth daily. ? ?Need for tetanus booster ?-     Tdap vaccine greater than or equal to 7yo IM ? ? ?PHQ/GAD not to goal.  ?Restart all medications including OCP.  ?Follow up in 3 months.  ?Discussed titration up to previous dose instead of starting them all at the same time.  ?Fasting labs ordered.  ?Follow up in 3 months.  ? ?

## 2021-08-13 LAB — COMPLETE METABOLIC PANEL WITH GFR
AG Ratio: 1.9 (calc) (ref 1.0–2.5)
ALT: 16 U/L (ref 6–29)
AST: 13 U/L (ref 10–30)
Albumin: 4.8 g/dL (ref 3.6–5.1)
Alkaline phosphatase (APISO): 61 U/L (ref 31–125)
BUN: 18 mg/dL (ref 7–25)
CO2: 27 mmol/L (ref 20–32)
Calcium: 10.1 mg/dL (ref 8.6–10.2)
Chloride: 104 mmol/L (ref 98–110)
Creat: 0.57 mg/dL (ref 0.50–0.96)
Globulin: 2.5 g/dL (calc) (ref 1.9–3.7)
Glucose, Bld: 87 mg/dL (ref 65–99)
Potassium: 4.4 mmol/L (ref 3.5–5.3)
Sodium: 140 mmol/L (ref 135–146)
Total Bilirubin: 0.3 mg/dL (ref 0.2–1.2)
Total Protein: 7.3 g/dL (ref 6.1–8.1)
eGFR: 133 mL/min/{1.73_m2} (ref >=60)

## 2021-08-13 LAB — CBC WITH DIFFERENTIAL/PLATELET
Absolute Monocytes: 335 cells/uL (ref 200–950)
Basophils Absolute: 37 cells/uL (ref 0–200)
Basophils Relative: 0.6 %
Eosinophils Absolute: 515 cells/uL — ABNORMAL HIGH (ref 15–500)
Eosinophils Relative: 8.3 %
HCT: 40 % (ref 35.0–45.0)
Hemoglobin: 13.3 g/dL (ref 11.7–15.5)
Lymphs Abs: 1928 cells/uL (ref 850–3900)
MCH: 30.1 pg (ref 27.0–33.0)
MCHC: 33.3 g/dL (ref 32.0–36.0)
MCV: 90.5 fL (ref 80.0–100.0)
MPV: 10.2 fL (ref 7.5–12.5)
Monocytes Relative: 5.4 %
Neutro Abs: 3385 cells/uL (ref 1500–7800)
Neutrophils Relative %: 54.6 %
Platelets: 346 10*3/uL (ref 140–400)
RBC: 4.42 10*6/uL (ref 3.80–5.10)
RDW: 11.9 % (ref 11.0–15.0)
Total Lymphocyte: 31.1 %
WBC: 6.2 10*3/uL (ref 3.8–10.8)

## 2021-08-13 LAB — IRON,TIBC AND FERRITIN PANEL
%SAT: 12 % (calc) — ABNORMAL LOW (ref 16–45)
Ferritin: 5 ng/mL — ABNORMAL LOW (ref 16–154)
Iron: 52 ug/dL (ref 40–190)
TIBC: 418 mcg/dL (calc) (ref 250–450)

## 2021-08-13 LAB — VITAMIN B12: Vitamin B-12: 323 pg/mL (ref 200–1100)

## 2021-08-13 LAB — TSH: TSH: 1.37 mIU/L

## 2021-08-15 ENCOUNTER — Encounter: Payer: Self-pay | Admitting: Physician Assistant

## 2021-08-15 DIAGNOSIS — E538 Deficiency of other specified B group vitamins: Secondary | ICD-10-CM | POA: Insufficient documentation

## 2021-08-15 DIAGNOSIS — R790 Abnormal level of blood mineral: Secondary | ICD-10-CM | POA: Insufficient documentation

## 2021-08-15 NOTE — Progress Notes (Signed)
Hemoglobin looks good.  ?Iron stores are low. I would increase iron rich foods or make sure iron in my multi vitamin daily.  ?Thyroid looks great.  ?Kidney, liver, glucose looks good.  ?B12 low. Start daily. Repeat in 3 months.

## 2021-08-23 ENCOUNTER — Other Ambulatory Visit: Payer: Self-pay | Admitting: Physician Assistant

## 2021-08-23 DIAGNOSIS — J301 Allergic rhinitis due to pollen: Secondary | ICD-10-CM

## 2021-08-24 DIAGNOSIS — F331 Major depressive disorder, recurrent, moderate: Secondary | ICD-10-CM | POA: Diagnosis not present

## 2021-08-31 DIAGNOSIS — F331 Major depressive disorder, recurrent, moderate: Secondary | ICD-10-CM | POA: Diagnosis not present

## 2021-09-05 DIAGNOSIS — F331 Major depressive disorder, recurrent, moderate: Secondary | ICD-10-CM | POA: Diagnosis not present

## 2021-09-12 DIAGNOSIS — F331 Major depressive disorder, recurrent, moderate: Secondary | ICD-10-CM | POA: Diagnosis not present

## 2021-09-19 DIAGNOSIS — F331 Major depressive disorder, recurrent, moderate: Secondary | ICD-10-CM | POA: Diagnosis not present

## 2021-10-05 DIAGNOSIS — F331 Major depressive disorder, recurrent, moderate: Secondary | ICD-10-CM | POA: Diagnosis not present

## 2021-10-12 DIAGNOSIS — F331 Major depressive disorder, recurrent, moderate: Secondary | ICD-10-CM | POA: Diagnosis not present

## 2021-10-21 DIAGNOSIS — F331 Major depressive disorder, recurrent, moderate: Secondary | ICD-10-CM | POA: Diagnosis not present

## 2021-10-27 DIAGNOSIS — F331 Major depressive disorder, recurrent, moderate: Secondary | ICD-10-CM | POA: Diagnosis not present

## 2021-11-01 DIAGNOSIS — F331 Major depressive disorder, recurrent, moderate: Secondary | ICD-10-CM | POA: Diagnosis not present

## 2021-11-08 DIAGNOSIS — F331 Major depressive disorder, recurrent, moderate: Secondary | ICD-10-CM | POA: Diagnosis not present

## 2021-11-14 ENCOUNTER — Ambulatory Visit: Payer: BC Managed Care – PPO | Admitting: Physician Assistant

## 2021-11-15 DIAGNOSIS — F331 Major depressive disorder, recurrent, moderate: Secondary | ICD-10-CM | POA: Diagnosis not present

## 2021-11-23 ENCOUNTER — Other Ambulatory Visit: Payer: Self-pay | Admitting: Physician Assistant

## 2021-11-23 DIAGNOSIS — J301 Allergic rhinitis due to pollen: Secondary | ICD-10-CM

## 2021-11-24 DIAGNOSIS — F331 Major depressive disorder, recurrent, moderate: Secondary | ICD-10-CM | POA: Diagnosis not present

## 2021-11-30 DIAGNOSIS — F331 Major depressive disorder, recurrent, moderate: Secondary | ICD-10-CM | POA: Diagnosis not present

## 2021-12-05 IMAGING — DX DG CHEST 2V
2 series · 2 of 2 positions shown · non-contrast
Comparison: November 20, 2016.

CLINICAL DATA: Chest tightness and soreness.

EXAM:
CHEST - 2 VIEW

[chest pa]
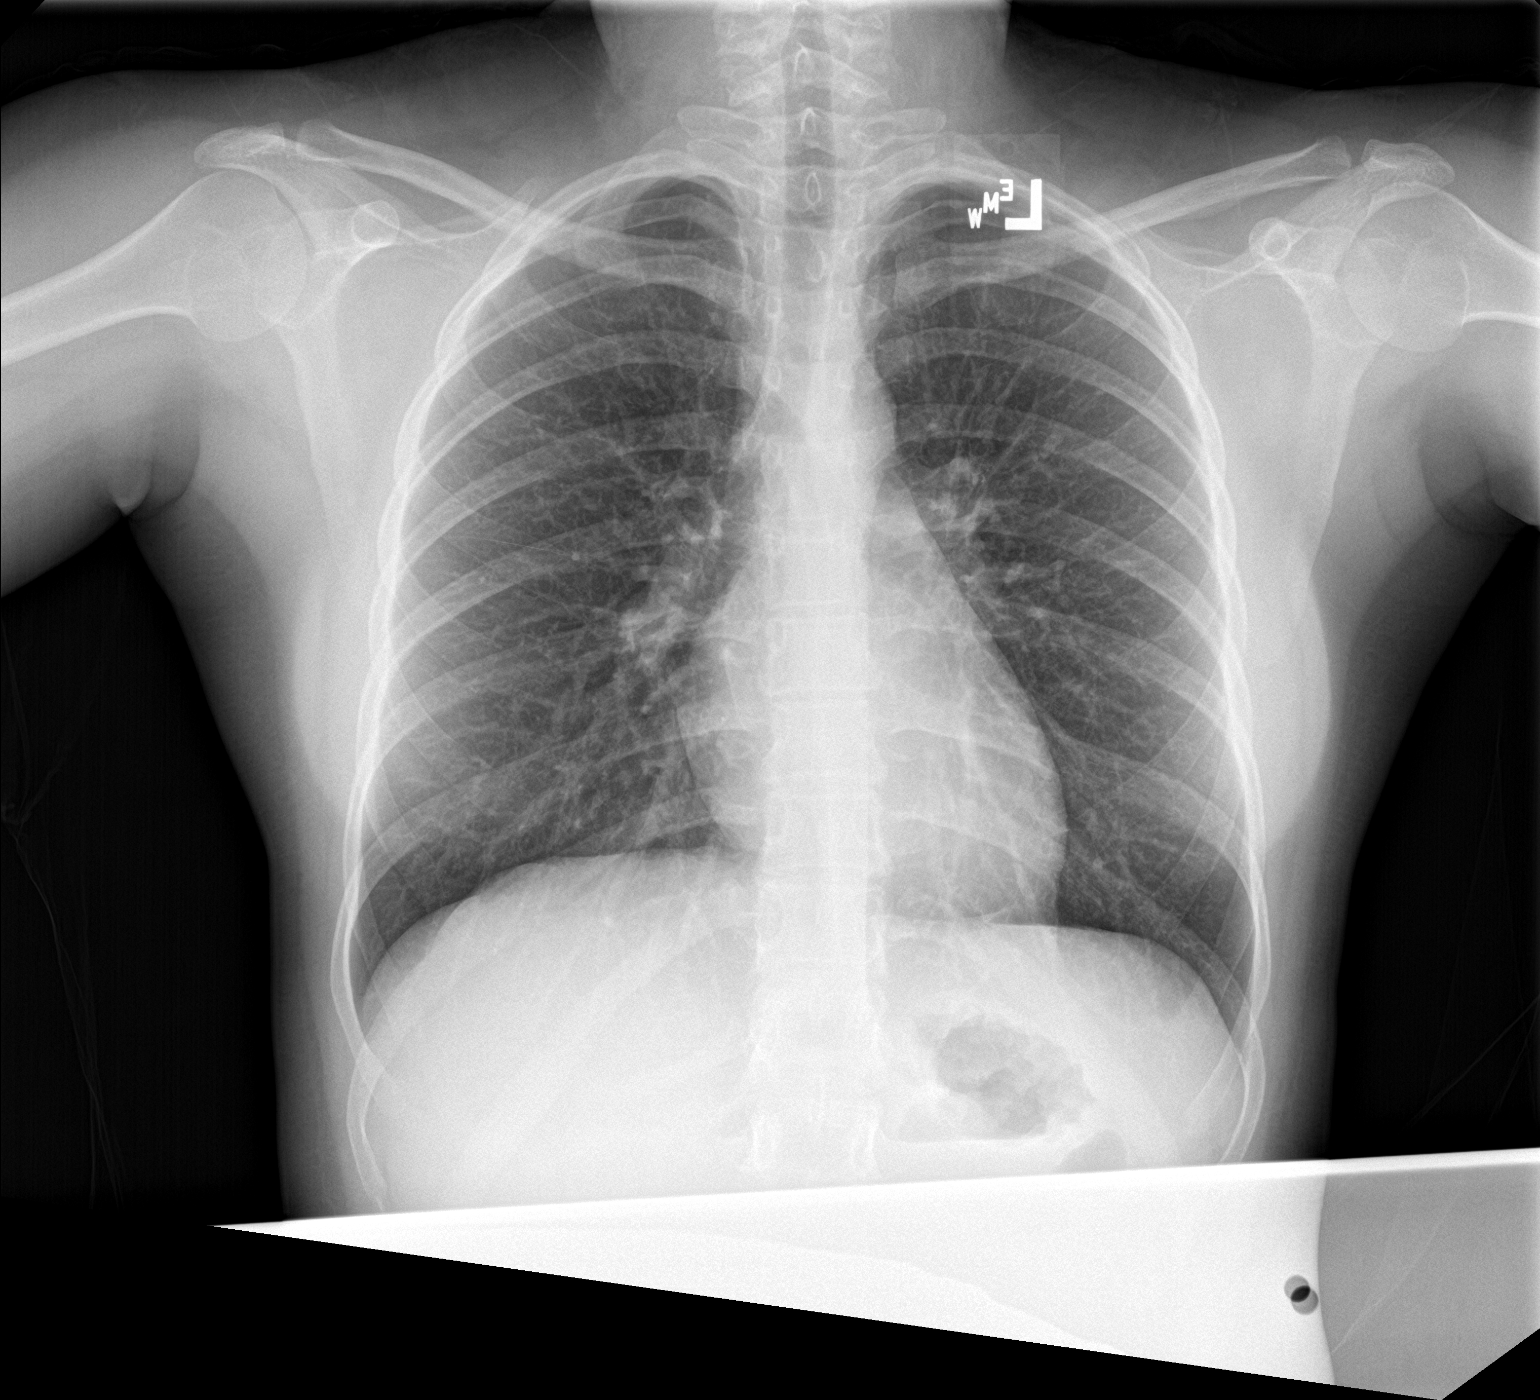

[chest lat]
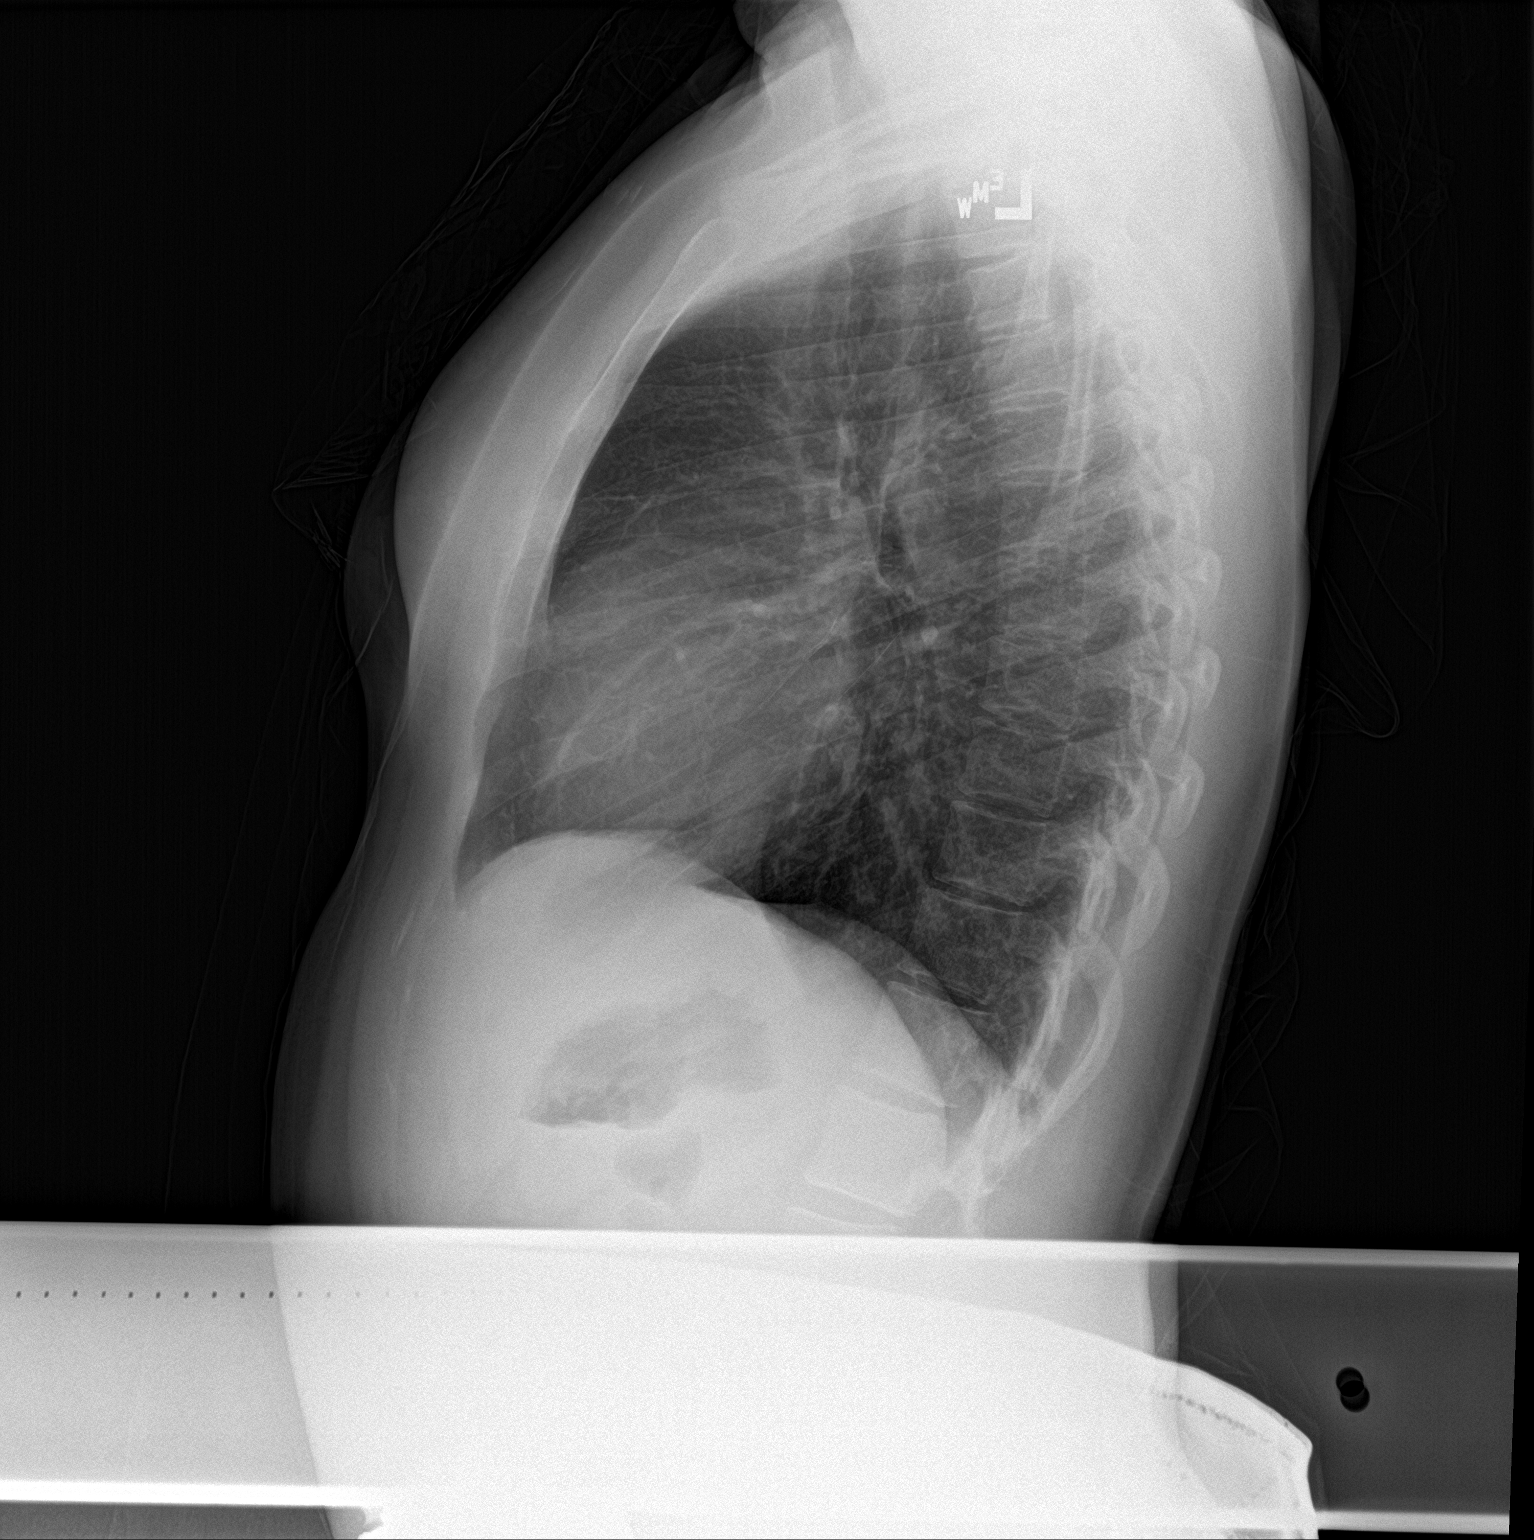

[2 of 2 positions shown; findings below may reference images not displayed]

FINDINGS: The heart size and mediastinal contours are within normal limits.
Both lungs are clear. The visualized skeletal structures are
unremarkable.
IMPRESSION: No active cardiopulmonary disease.

## 2021-12-07 DIAGNOSIS — F331 Major depressive disorder, recurrent, moderate: Secondary | ICD-10-CM | POA: Diagnosis not present

## 2021-12-14 DIAGNOSIS — F331 Major depressive disorder, recurrent, moderate: Secondary | ICD-10-CM | POA: Diagnosis not present

## 2021-12-20 DIAGNOSIS — F331 Major depressive disorder, recurrent, moderate: Secondary | ICD-10-CM | POA: Diagnosis not present

## 2021-12-28 DIAGNOSIS — F331 Major depressive disorder, recurrent, moderate: Secondary | ICD-10-CM | POA: Diagnosis not present

## 2021-12-29 ENCOUNTER — Encounter: Payer: Self-pay | Admitting: Neurology

## 2022-01-03 DIAGNOSIS — F331 Major depressive disorder, recurrent, moderate: Secondary | ICD-10-CM | POA: Diagnosis not present

## 2022-01-17 DIAGNOSIS — F331 Major depressive disorder, recurrent, moderate: Secondary | ICD-10-CM | POA: Diagnosis not present

## 2022-01-31 DIAGNOSIS — F331 Major depressive disorder, recurrent, moderate: Secondary | ICD-10-CM | POA: Diagnosis not present

## 2022-02-08 DIAGNOSIS — F331 Major depressive disorder, recurrent, moderate: Secondary | ICD-10-CM | POA: Diagnosis not present

## 2022-02-18 ENCOUNTER — Other Ambulatory Visit: Payer: Self-pay | Admitting: Physician Assistant

## 2022-02-18 DIAGNOSIS — J301 Allergic rhinitis due to pollen: Secondary | ICD-10-CM

## 2022-02-21 ENCOUNTER — Encounter: Payer: Self-pay | Admitting: Physician Assistant

## 2022-02-21 ENCOUNTER — Ambulatory Visit: Payer: BC Managed Care – PPO | Admitting: Physician Assistant

## 2022-02-21 VITALS — Ht <= 58 in | Wt 135.0 lb

## 2022-02-21 DIAGNOSIS — H8111 Benign paroxysmal vertigo, right ear: Secondary | ICD-10-CM

## 2022-02-21 DIAGNOSIS — R42 Dizziness and giddiness: Secondary | ICD-10-CM | POA: Diagnosis not present

## 2022-02-21 DIAGNOSIS — Z23 Encounter for immunization: Secondary | ICD-10-CM | POA: Diagnosis not present

## 2022-02-21 MED ORDER — MECLIZINE HCL 25 MG PO TABS: 25.0000 mg | ORAL_TABLET | Freq: Three times a day (TID) | ORAL | 0 refills | Status: DC | PRN

## 2022-02-21 NOTE — Progress Notes (Signed)
Acute Office Visit  Subjective:     Patient ID: Lauren Zamora, female    DOB: 2001/03/01, 21 y.o.   MRN: [id number]  Chief Complaint  Patient presents with   Dizziness    HPI Patient is in today for dizziness since Friday of last week, 5 days. She admits to suddenly running out of all her medications for 4 days. She did start them all back Saturday. She feels like at times the "room is spinning". She has some brain fog. She is having some tingling sensations of fingers. She does admit to more dizziness with position changes. No congestion, fever, chills, body aches, sinus pressure, or ear pain.   .. Active Ambulatory Problems    Diagnosis Date Noted   GAD (generalized anxiety disorder) 08/11/2015   Asthma, mild intermittent 08/11/2015   Non-seasonal allergic rhinitis 11/30/2016   Dysmenorrhea in adolescent 12/13/2016   Menorrhagia with regular cycle 12/13/2016   Low folate 12/19/2016   Persistent headaches 07/06/2017   Post concussion syndrome 07/06/2017   Panic attacks 10/02/2017   Weight gain 10/02/2017   No energy [id number]   Folic acid deficiency [id number]   Moderate episode of recurrent major depressive disorder ([place]) 04/16/2019   Mood disorder ([place]) 04/16/2019   Deliberate self-cutting 04/16/2019   Chest pain 07/07/2019   Binge eating 01/04/2020   Borderline personality disorder ([place]) 07/16/2020   Low iron stores 08/15/2021   Low serum vitamin B12 08/15/2021   Resolved Ambulatory Problems    Diagnosis Date Noted   ASTHMA, ACUTE 01/07/2009   Left cervical lymphadenopathy 10/15/2015   Pneumonitis 11/20/2016   Past Medical History:  Diagnosis Date   Asthma      ROS See HPI.      Objective:    Ht 4\' 8"  (1.422 m)   Wt 135 lb (61.2 kg)   SpO2 99%   BMI 30.27 kg/m  BP Readings from Last 3 Encounters:  08/12/21 124/76  12/31/19 124/61  12/03/19 127/76   Wt Readings from Last 3 Encounters:  02/21/22 135 lb (61.2 kg)  08/12/21 139 lb (63 kg)   07/16/20 140 lb (63.5 kg)    .Marland KitchenOrthostatic VS for the past 24 hrs (Last 3 readings):  BP- Lying Pulse- Lying BP- Sitting Pulse- Sitting BP- Standing at 0 minutes Pulse- Standing at 0 minutes  02/21/22 0913 114/59 66 107/71 68 114/70 72     Physical Exam Constitutional:      Appearance: Normal appearance.  HENT:     Head: Normocephalic.  Cardiovascular:     Rate and Rhythm: Normal rate.  Pulmonary:     Effort: Pulmonary effort is normal.  Neurological:     General: No focal deficit present.     Mental Status: She is alert and oriented to person, place, and time.     Cranial Nerves: No cranial nerve deficit.     Sensory: No sensory deficit.     Motor: No weakness.     Coordination: Coordination normal.     Gait: Gait normal.     Deep Tendon Reflexes: Reflexes normal.     Comments: Dix hallpike with more dizziness and possible few episodes of nystagmus when head to the right  Psychiatric:        Mood and Affect: Mood normal.           Assessment & Plan:  Marland KitchenMarland KitchenHaleigh was seen today for dizziness.  Diagnoses and all orders for this visit:  BPPV (benign paroxysmal positional vertigo), right -  meclizine (ANTIVERT) 25 MG tablet; Take 1 tablet (25 mg total) by mouth 3 (three) times daily as needed for dizziness.  Dizziness -     meclizine (ANTIVERT) 25 MG tablet; Take 1 tablet (25 mg total) by mouth 3 (three) times daily as needed for dizziness.  Flu vaccine need -     Flu Vaccine QUAD 17mo+IM (Fluarix, Fluzone & Alfiuria Quad PF)   BP looks great today I do think sudden withdrawal of many medications caused dizziness Stay on medications for re-set Marye Round with lots of vertigo dizziness to the right likely some BPPV as well Antivert given to use as needed up to three times a day Start epley maneuvers 3 rounds 3 times a day Written out of work for today and tomorrow Follow up if symptoms not improving or worsening   Iran Planas, PA-C

## 2022-02-22 DIAGNOSIS — F331 Major depressive disorder, recurrent, moderate: Secondary | ICD-10-CM | POA: Diagnosis not present

## 2022-02-28 DIAGNOSIS — F331 Major depressive disorder, recurrent, moderate: Secondary | ICD-10-CM | POA: Diagnosis not present

## 2022-03-08 DIAGNOSIS — F331 Major depressive disorder, recurrent, moderate: Secondary | ICD-10-CM | POA: Diagnosis not present

## 2022-03-14 DIAGNOSIS — F331 Major depressive disorder, recurrent, moderate: Secondary | ICD-10-CM | POA: Diagnosis not present

## 2022-03-21 DIAGNOSIS — F331 Major depressive disorder, recurrent, moderate: Secondary | ICD-10-CM | POA: Diagnosis not present

## 2022-03-28 DIAGNOSIS — F331 Major depressive disorder, recurrent, moderate: Secondary | ICD-10-CM | POA: Diagnosis not present

## 2022-04-04 DIAGNOSIS — F331 Major depressive disorder, recurrent, moderate: Secondary | ICD-10-CM | POA: Diagnosis not present

## 2022-04-11 ENCOUNTER — Encounter: Payer: Self-pay | Admitting: Physician Assistant

## 2022-04-11 ENCOUNTER — Ambulatory Visit: Payer: BC Managed Care – PPO | Admitting: Physician Assistant

## 2022-04-11 VITALS — BP 123/75 | HR 85 | Temp 98.4°F | Ht <= 58 in | Wt 138.0 lb

## 2022-04-11 DIAGNOSIS — F331 Major depressive disorder, recurrent, moderate: Secondary | ICD-10-CM | POA: Diagnosis not present

## 2022-04-11 DIAGNOSIS — J040 Acute laryngitis: Secondary | ICD-10-CM | POA: Diagnosis not present

## 2022-04-11 DIAGNOSIS — J0140 Acute pansinusitis, unspecified: Secondary | ICD-10-CM | POA: Diagnosis not present

## 2022-04-11 MED ORDER — AMOXICILLIN-POT CLAVULANATE 875-125 MG PO TABS: 1.0000 | ORAL_TABLET | Freq: Two times a day (BID) | ORAL | 0 refills | Status: DC

## 2022-04-11 NOTE — Progress Notes (Signed)
Acute Office Visit  Subjective:     Patient ID: Lauren Zamora, female    DOB: 12-Aug-2000, 21 y.o.   MRN: [id number]  Chief Complaint  Patient presents with   Sinus Problem    HPI Patient is in today for cough, lost voice, sinus pressure, congestion, headache, ST for the last 9 days. She has taken tylenol cold sinus severe and other cough drops. She is some better but symptoms are lingering. She denies any fever, chills, body aches, SOB, wheezing. She is coughing up and blowing out yellow to green mucus.   .. Active Ambulatory Problems    Diagnosis Date Noted   GAD (generalized anxiety disorder) 08/11/2015   Asthma, mild intermittent 08/11/2015   Non-seasonal allergic rhinitis 11/30/2016   Dysmenorrhea in adolescent 12/13/2016   Menorrhagia with regular cycle 12/13/2016   Low folate 12/19/2016   Persistent headaches 07/06/2017   Post concussion syndrome 07/06/2017   Panic attacks 10/02/2017   Weight gain 10/02/2017   No energy 10/02/2017   Folic acid deficiency 10/03/2017   Moderate episode of recurrent major depressive disorder (HCC) 04/16/2019   Mood disorder (HCC) 04/16/2019   Deliberate self-cutting 04/16/2019   Chest pain 07/07/2019   Binge eating 01/04/2020   Borderline personality disorder (HCC) 07/16/2020   Low iron stores 08/15/2021   Low serum vitamin B12 08/15/2021   Resolved Ambulatory Problems    Diagnosis Date Noted   ASTHMA, ACUTE 01/07/2009   Left cervical lymphadenopathy 10/15/2015   Pneumonitis 11/20/2016   Past Medical History:  Diagnosis Date   Asthma      ROS  See HPI.     Objective:    BP 123/75   Pulse 85   Temp 98.4 F (36.9 C) (Oral)   Ht 4\' 8"  (1.422 m)   Wt 138 lb (62.6 kg)   SpO2 99%   BMI 30.94 kg/m  BP Readings from Last 3 Encounters:  04/11/22 123/75  08/12/21 124/76  12/31/19 124/61   Wt Readings from Last 3 Encounters:  04/11/22 138 lb (62.6 kg)  02/21/22 135 lb (61.2 kg)  08/12/21 139 lb (63 kg)       Physical Exam Constitutional:      Appearance: Normal appearance.  HENT:     Head: Normocephalic.     Right Ear: Tympanic membrane, ear canal and external ear normal. There is no impacted cerumen.     Left Ear: Tympanic membrane, ear canal and external ear normal. There is no impacted cerumen.     Nose: Congestion present.     Mouth/Throat:     Mouth: Mucous membranes are moist.     Pharynx: Posterior oropharyngeal erythema present. No oropharyngeal exudate.  Eyes:     Conjunctiva/sclera: Conjunctivae normal.  Neck:     Vascular: No carotid bruit.  Cardiovascular:     Rate and Rhythm: Normal rate and regular rhythm.  Pulmonary:     Effort: Pulmonary effort is normal.     Breath sounds: Normal breath sounds.  Musculoskeletal:     Cervical back: Normal range of motion and neck supple. Tenderness present.  Lymphadenopathy:     Cervical: Cervical adenopathy present.  Neurological:     General: No focal deficit present.     Mental Status: She is alert and oriented to person, place, and time.  Psychiatric:        Mood and Affect: Mood normal.         Assessment & Plan:  03/19/23Marland KitchenHaleigh was seen today for sinus problem.  Diagnoses and all orders for this visit:  Laryngitis  Acute non-recurrent pansinusitis -     amoxicillin-clavulanate (AUGMENTIN) 875-125 MG tablet; Take 1 tablet by mouth 2 (two) times daily.   9 days of viral URI Dicussed symptomatic care of laryngitis Start augmentin Encouraged voice rest and hydration If not improving or symptoms still lingering consider prednisone   Tandy Gaw, PA-C

## 2022-04-11 NOTE — Progress Notes (Signed)
Started 9 days ago Sore throat  Cough Lost voice  Negative Covid  Mucinex/Delsym  All symptoms are gone except loss of voice

## 2022-04-11 NOTE — Patient Instructions (Addendum)
Sinus Infection, Adult A sinus infection, also called sinusitis, is inflammation of your sinuses. Sinuses are hollow spaces in the bones around your face. Your sinuses are located: Around your eyes. In the middle of your forehead. Behind your nose. In your cheekbones. Mucus normally drains out of your sinuses. When your nasal tissues become inflamed or swollen, mucus can become trapped or blocked. This allows bacteria, viruses, and fungi to grow, which leads to infection. Most infections of the sinuses are caused by a virus. A sinus infection can develop quickly. It can last for up to 4 weeks (acute) or for more than 12 weeks (chronic). A sinus infection often develops after a cold. What are the causes? This condition is caused by anything that creates swelling in the sinuses or stops mucus from draining. This includes: Allergies. Asthma. Infection from bacteria or viruses. Deformities or blockages in your nose or sinuses. Abnormal growths in the nose (nasal polyps). Pollutants, such as chemicals or irritants in the air. Infection from fungi. This is rare. What increases the risk? You are more likely to develop this condition if you: Have a weak body defense system (immune system). Do a lot of swimming or diving. Overuse nasal sprays. Smoke. What are the signs or symptoms? The main symptoms of this condition are pain and a feeling of pressure around the affected sinuses. Other symptoms include: Stuffy nose or congestion that makes it difficult to breathe through your nose. Thick yellow or greenish drainage from your nose. Tenderness, swelling, and warmth over the affected sinuses. A cough that may get worse at night. Decreased sense of smell and taste. Extra mucus that collects in the throat or the back of the nose (postnasal drip) causing a sore throat or bad breath. Tiredness (fatigue). Fever. How is this diagnosed? This condition is diagnosed based on: Your symptoms. Your  medical history. A physical exam. Tests to find out if your condition is acute or chronic. This may include: Checking your nose for nasal polyps. Viewing your sinuses using a device that has a light (endoscope). Testing for allergies or bacteria. Imaging tests, such as an MRI or CT scan. In rare cases, a bone biopsy may be done to rule out more serious types of fungal sinus disease. How is this treated? Treatment for a sinus infection depends on the cause and whether your condition is chronic or acute. If caused by a virus, your symptoms should go away on their own within 10 days. You may be given medicines to relieve symptoms. They include: Medicines that shrink swollen nasal passages (decongestants). A spray that eases inflammation of the nostrils (topical intranasal corticosteroids). Rinses that help get rid of thick mucus in your nose (nasal saline washes). Medicines that treat allergies (antihistamines). Over-the-counter pain relievers. If caused by bacteria, your health care provider may recommend waiting to see if your symptoms improve. Most bacterial infections will get better without antibiotic medicine. You may be given antibiotics if you have: A severe infection. A weak immune system. If caused by narrow nasal passages or nasal polyps, surgery may be needed. Follow these instructions at home: Medicines Take, use, or apply over-the-counter and prescription medicines only as told by your health care provider. These may include nasal sprays. If you were prescribed an antibiotic medicine, take it as told by your health care provider. Do not stop taking the antibiotic even if you start to feel better. Hydrate and humidify  Drink enough fluid to keep your urine pale yellow. Staying hydrated will help   to thin your mucus. Use a cool mist humidifier to keep the humidity level in your home above 50%. Inhale steam for 10-15 minutes, 3-4 times a day, or as told by your health care  provider. You can do this in the bathroom while a hot shower is running. Limit your exposure to cool or dry air. Rest Rest as much as possible. Sleep with your head raised (elevated). Make sure you get enough sleep each night. General instructions  Apply a warm, moist washcloth to your face 3-4 times a day or as told by your health care provider. This will help with discomfort. Use nasal saline washes as often as told by your health care provider. Wash your hands often with soap and water to reduce your exposure to germs. If soap and water are not available, use hand sanitizer. Do not smoke. Avoid being around people who are smoking (secondhand smoke). Keep all follow-up visits. This is important. Contact a health care provider if: You have a fever. Your symptoms get worse. Your symptoms do not improve within 10 days. Get help right away if: You have a severe headache. You have persistent vomiting. You have severe pain or swelling around your face or eyes. You have vision problems. You develop confusion. Your neck is stiff. You have trouble breathing. These symptoms may be an emergency. Get help right away. Call 911. Do not wait to see if the symptoms will go away. Do not drive yourself to the hospital. Summary A sinus infection is soreness and inflammation of your sinuses. Sinuses are hollow spaces in the bones around your face. This condition is caused by nasal tissues that become inflamed or swollen. The swelling traps or blocks the flow of mucus. This allows bacteria, viruses, and fungi to grow, which leads to infection. If you were prescribed an antibiotic medicine, take it as told by your health care provider. Do not stop taking the antibiotic even if you start to feel better. Keep all follow-up visits. This is important. This information is not intended to replace advice given to you by your health care provider. Make sure you discuss any questions you have with your health  care provider. Document Revised: 04/19/2021 Document Reviewed: 04/19/2021 Elsevier Patient Education  2023 [employer] Laryngitis  Laryngitis is inflammation of the vocal cords that causes symptoms such as hoarseness or loss of voice. The vocal cords are two bands of muscles in your throat. When you speak, these cords come together and vibrate. The vibrations come out through your mouth as sound. When your vocal cords are inflamed, your voice sounds different. Laryngitis can be temporary (acute) or long-term (chronic). Most cases of acute laryngitis improve with time. Chronic laryngitis is laryngitis that lasts for more than 3 weeks. What are the causes? Acute laryngitis may be caused by: A viral infection. Lots of talking, yelling, or singing. This is also called vocal strain. A bacterial infection. Chronic laryngitis may be caused by: Vocal strain or an injury to the vocal cords. Acid reflux (gastroesophageal reflux disease, or GERD). Allergies, a sinus infection, or postnasal drip. Smoking. Excessive alcohol use. Breathing in chemicals or dust. Growths on the vocal cords. What increases the risk? The following factors may make you more likely to develop this condition: Smoking. Alcohol abuse. Having allergies. Chronic irritants in the workplace, such as toxic fumes. What are the signs or symptoms? Symptoms of this condition may include: Low, hoarse voice. Loss of voice. Dry cough. Sore or dry throat. Stuffy or congested  nose. How is this diagnosed? This condition may be diagnosed based on: Your symptoms and a physical exam. Throat culture. Blood test. A procedure in which your health care provider looks at your vocal cords with a mirror or viewing tube (laryngoscopy). How is this treated? Treatment for laryngitis depends on what is causing it. Usually, treatment involves resting your voice and using medicines to soothe your throat. If your laryngitis is caused by a  bacterial infection, you may need to take antibiotic medicine. If your laryngitis is caused by a growth, you may need to have a procedure to remove it. Follow these instructions at home: Medicines Take over-the-counter and prescription medicines only as told by your health care provider. If you were prescribed an antibiotic medicine, take it as told by your health care provider. Do not stop taking the antibiotic even if you start to feel better. Use throat lozenges or sprays to soothe your throat as told by your health care provider. General instructions  Talk as little as possible. To do this: Write instead of talking. Do this until your voice is back to normal. Avoid whispering, which can cause vocal strain. Gargle with a mixture of salt and water 3-4 times a day or as needed. To make salt water, completely dissolve -1 tsp (3-6 g) of salt in 1 cup (237 mL) of warm water. Drink enough fluid to keep your urine pale yellow. Breathe in moist air. Use a humidifier if you live in a dry climate. Do not use any products that contain nicotine or tobacco. These products include cigarettes, chewing tobacco, and vaping devices, such as e-cigarettes. If you need help quitting, ask your health care provider. Contact a health care provider if: You have a fever. You have increasing pain. Your symptoms do not get better in 2 weeks. Get help right away if: You cough up blood. You have difficulty swallowing. You have trouble breathing. Summary Laryngitis is inflammation of the vocal cords that causes symptoms such as hoarseness or loss of voice. Laryngitis can be temporary or long-term. Treatment for laryngitis depends on the cause. It often involves resting your voice and using medicine to soothe your throat. Get help right away if you have difficulty swallowing or breathing or if you cough up blood. This information is not intended to replace advice given to you by your health care provider. Make sure  you discuss any questions you have with your health care provider. Document Revised: 08/02/2020 Document Reviewed: 08/02/2020 Elsevier Patient Education  [place].

## 2022-04-18 DIAGNOSIS — F331 Major depressive disorder, recurrent, moderate: Secondary | ICD-10-CM | POA: Diagnosis not present

## 2022-04-25 DIAGNOSIS — F331 Major depressive disorder, recurrent, moderate: Secondary | ICD-10-CM | POA: Diagnosis not present

## 2022-05-02 ENCOUNTER — Encounter: Payer: Self-pay | Admitting: Physician Assistant

## 2022-05-02 DIAGNOSIS — F331 Major depressive disorder, recurrent, moderate: Secondary | ICD-10-CM | POA: Diagnosis not present

## 2022-05-02 DIAGNOSIS — T7840XA Allergy, unspecified, initial encounter: Secondary | ICD-10-CM | POA: Diagnosis not present

## 2022-05-02 DIAGNOSIS — U071 COVID-19: Secondary | ICD-10-CM | POA: Diagnosis not present

## 2022-05-02 NOTE — Telephone Encounter (Signed)
Called patient - she stats she is still experiencing symptoms of throat feelling like closing ( knott in throat) and some difficulty breathing ( difficult to take a deep breath)  headache and difficulty clearing throat has taken bendadryl last night and 2 bendadryl today . Spoke with Dr. Wyline Mood who suggested that she go to ER for evaluation as even though it has been 48 hours symptoms are persisting. Patient is informed and agrees to go to ER.

## 2022-05-09 DIAGNOSIS — F331 Major depressive disorder, recurrent, moderate: Secondary | ICD-10-CM | POA: Diagnosis not present

## 2022-05-16 DIAGNOSIS — F331 Major depressive disorder, recurrent, moderate: Secondary | ICD-10-CM | POA: Diagnosis not present

## 2022-05-18 ENCOUNTER — Other Ambulatory Visit: Payer: Self-pay | Admitting: Physician Assistant

## 2022-05-18 DIAGNOSIS — J301 Allergic rhinitis due to pollen: Secondary | ICD-10-CM

## 2022-05-25 DIAGNOSIS — F331 Major depressive disorder, recurrent, moderate: Secondary | ICD-10-CM | POA: Diagnosis not present

## 2022-06-02 DIAGNOSIS — F331 Major depressive disorder, recurrent, moderate: Secondary | ICD-10-CM | POA: Diagnosis not present

## 2022-06-06 DIAGNOSIS — F331 Major depressive disorder, recurrent, moderate: Secondary | ICD-10-CM | POA: Diagnosis not present

## 2022-06-16 DIAGNOSIS — F331 Major depressive disorder, recurrent, moderate: Secondary | ICD-10-CM | POA: Diagnosis not present

## 2022-06-23 DIAGNOSIS — F331 Major depressive disorder, recurrent, moderate: Secondary | ICD-10-CM | POA: Diagnosis not present

## 2022-07-04 ENCOUNTER — Encounter: Payer: Self-pay | Admitting: Physician Assistant

## 2022-07-04 ENCOUNTER — Ambulatory Visit: Payer: BC Managed Care – PPO | Admitting: Physician Assistant

## 2022-07-04 VITALS — BP 122/88 | HR 83 | Temp 98.2°F | Ht <= 58 in | Wt 140.0 lb

## 2022-07-04 DIAGNOSIS — Z842 Family history of other diseases of the genitourinary system: Secondary | ICD-10-CM | POA: Diagnosis not present

## 2022-07-04 DIAGNOSIS — R109 Unspecified abdominal pain: Secondary | ICD-10-CM

## 2022-07-04 DIAGNOSIS — N920 Excessive and frequent menstruation with regular cycle: Secondary | ICD-10-CM | POA: Diagnosis not present

## 2022-07-04 DIAGNOSIS — R42 Dizziness and giddiness: Secondary | ICD-10-CM

## 2022-07-04 DIAGNOSIS — R519 Headache, unspecified: Secondary | ICD-10-CM

## 2022-07-04 DIAGNOSIS — R10A2 Flank pain, left side: Secondary | ICD-10-CM

## 2022-07-04 DIAGNOSIS — R1032 Left lower quadrant pain: Secondary | ICD-10-CM

## 2022-07-04 LAB — POCT URINALYSIS DIP (CLINITEK)
Bilirubin, UA: NEGATIVE
Glucose, UA: NEGATIVE mg/dL
Ketones, POC UA: NEGATIVE mg/dL
Leukocytes, UA: NEGATIVE
Nitrite, UA: NEGATIVE
Spec Grav, UA: >=1.03 — AB (ref 1.010–1.025)
Urobilinogen, UA: 0.2 E.U./dL
pH, UA: 6 (ref 5.0–8.0)

## 2022-07-04 MED ORDER — NORETHIN ACE-ETH ESTRAD-FE 1.5-30 MG-MCG PO TABS: 1.0000 | ORAL_TABLET | Freq: Every day | ORAL | 0 refills | Status: DC

## 2022-07-04 NOTE — Patient Instructions (Addendum)
Get ultrasound today at 1pm Out of work through tomorrow Rest and hydration Continue ibuprofen   Concussion, Adult  A concussion is a brain injury from a hard, direct hit (trauma) to your head or body. This hit causes your brain to quickly shake back and forth inside your skull. A concussion may also be called a mild traumatic brain injury (TBI). Healing from this injury can take time. The effects of a concussion can be serious. If you have a concussion, you should be very careful to avoid having a second concussion. What are the causes? This condition is caused by: A direct hit to your head. A quick and sudden movement of the head or neck, such as in a car crash. What are the signs or symptoms? The signs of a concussion can be hard to notice. They may be missed by you, family members, and doctors. You may look fine on the outside but may not act or feel normal. Physical symptoms Headaches or feeling dizzy. Problems with body balance. Being sensitive to light or noise. Vomiting or feeling like you may vomit. Being tired. Problems seeing or hearing. Seizure. Mental and emotional symptoms Feeling grouchy (irritable) or having mood changes. Problems remembering things. Trouble focusing your mind (concentrating), organizing, or making decisions. Not sleeping or eating as you used to. Being slow to think, act, react, speak, or read. Feeling worried or nervous (anxious). Feeling sad (depressed). How is this treated? This condition may be treated by: Stopping sports or activity if you are injured. Resting your body and your mind. Being watched carefully, often at home. Medicines to help with symptoms such as: Headaches. Feeling like you may vomit. Problems with sleep. You may need to go to a concussion clinic or a place to help you recover (rehab). Follow these instructions at home: Activity Limit activities that need a lot of thought or focus, such as: Homework or work for your  job. Watching TV. Using the computer or phone. Playing memory games and puzzles. Get rest because this helps your brain heal. Make sure you: Get plenty of sleep. Most adults should get 7-9 hours of sleep each night. Rest during the day. Take naps or breaks when you feel tired. Avoid activity or exercise that takes a lot of effort until your doctor says it is safe. Stop any activity that makes symptoms worse. Your doctor may tell you to do light exercise like walking. Do not do activities that could cause a second concussion, such as riding a bike or playing sports. Ask your doctor when you can return to your normal activities, such as school, work, sports, and driving. Your ability to react may be slower. Do not do these activities if you are dizzy. General instructions  Take over-the-counter and prescription medicines only as told by your doctor. Avoid taking strong pain medicines (opioids) after a concussion. Do not drink alcohol until your doctor says you can. Watch your symptoms and tell other people to do the same. Other problems can occur after a concussion. Tell your work Freight forwarder, teachers, Government social research officer, school counselor, coach, or Product/process development scientist about your injury and symptoms. Tell them about what you can or cannot do. See a mental health therapist if you keep feeling worried and nervous or sad. Keep all follow-up visits. Your doctor will check on your recovery and give you a plan for returning to activities. How is this prevented? It is very important that you do not get another brain injury. In rare cases, another injury  can cause brain damage that will not go away, brain swelling, or death. The risk of this is greatest in the first 7-10 days after a head injury. To avoid injuries: Stop activities that could lead to a second concussion, such as contact sports, until your doctor says it is okay. When you return to sports or activities: Do not crash into other players. This is  how most concussions happen. Follow the rules. Respect other players. Do not engage in violent behavior while playing. Get regular exercise. Do strength and balance training. Wear a helmet that fits you well during sports, biking, or other activities. Helmets can help protect you from serious skull and brain injuries, but they may not protect you from a concussion. Even when wearing a helmet, you should avoid being hit in the head. Where to find more information Centers for Disease Control and Prevention: [employer]cy Contact a doctor if: Your symptoms do not get better or get worse. You have new symptoms. You have another injury. Your balance gets worse. You have changes in how you act. Get help right away if: You have very bad headaches or your headaches get worse. You have any of these problems: Feeling weak or numb in any part of your body. Slurred speech. Changes in how you see (vision). Feeling mixed up (confused). You vomit often. You faint or other people have trouble waking you up. You have a seizure. These symptoms may be an emergency. Get help right away. Call 911. Do not wait to see if the symptoms will go away. Do not drive yourself to the hospital. Also, get help right away if: You have thoughts of hurting yourself or others. Take one of these steps if you feel like you may hurt yourself or others, or have thoughts about taking your own life: Go to your nearest emergency room. Call 911. Call the [facility] at [phone] or 988. This is open 24 hours a day. Text the Crisis Text Line at 7042511834. This information is not intended to replace advice given to you by your health care provider. Make sure you discuss any questions you have with your health care provider. Document Revised: 10/07/2021 Document Reviewed: 10/07/2021 Elsevier Patient Education  [place].

## 2022-07-04 NOTE — Progress Notes (Signed)
Acute Office Visit  Subjective:     Patient ID: Rema Fendt, female    DOB: [birth year], 22 y.o.   MRN: [id number]  Chief Complaint  Patient presents with   Cyst    HPI Patient is 22 year old female with CC of left lower abdominal pain. She has PMH significant for dysmenorrhea, menrorrhagia, and PMDD. She takes oral contraceptives for symptoms. Pt states 5 days ago she had sharp stabbing pain 10/10 in left lower quadrant that lasted about an hour. The pain has now progressed to cramping/dull ache that she states is about a 4 out of 10 at all times. She tried taking ibuprofen with not much relief. Pt states mother has a history of ovarian cysts. She denies any fever, discharge, bleeding, dysuria, or increased urinary frequency. Pt last menstrual period was 2 weeks ago.  Pt also fell on wet floor last night at work and thinks she may have a concussion. She states she does not think she hit her head but states she did experience some whip lash. She endorses nausea and feeling foggy shortly after the fall. Today she endorses sensitivity to sound and light. Denies any changes in vision or balance.  Review of Systems  Constitutional:  Negative for fever.  HENT:  Negative for ear discharge, nosebleeds and tinnitus.   Eyes:  Positive for photophobia. Negative for blurred vision and double vision.  Gastrointestinal:  Negative for constipation and vomiting.  Genitourinary:  Positive for flank pain. Negative for dysuria, frequency and urgency.  Neurological:  Positive for headaches. Negative for dizziness and focal weakness.        Objective:    BP 122/88   Pulse 83   Temp 98.2 F (36.8 C)   Ht 4\' 8"  (1.422 m)   Wt 63.5 kg   SpO2 100%   BMI 31.39 kg/m  BP Readings from Last 3 Encounters:  07/04/22 122/88  04/11/22 123/75  08/12/21 124/76   Wt Readings from Last 3 Encounters:  07/04/22 140 lb (63.5 kg)  04/11/22 138 lb (62.6 kg)  02/21/22 135 lb (61.2 kg)    .Marland Kitchen Results for  orders placed or performed in visit on 07/04/22  POCT URINALYSIS DIP (CLINITEK)  Result Value Ref Range   Color, UA straw (A) yellow   Clarity, UA hazy (A) clear   Glucose, UA negative negative mg/dL   Bilirubin, UA negative negative   Ketones, POC UA negative negative mg/dL   Spec Grav, UA >=1.030 (A) 1.010 - 1.025   Blood, UA trace-lysed (A) negative   pH, UA 6.0 5.0 - 8.0   POC PROTEIN,UA trace negative, trace   Urobilinogen, UA 0.2 0.2 or 1.0 E.U./dL   Nitrite, UA Negative Negative   Leukocytes, UA Negative Negative     Physical Exam Constitutional:      General: She is not in acute distress.    Appearance: Normal appearance.  HENT:     Head: Normocephalic and atraumatic.     Right Ear: Tympanic membrane, ear canal and external ear normal.     Left Ear: Tympanic membrane, ear canal and external ear normal.     Nose: Nose normal.  Eyes:     Extraocular Movements: Extraocular movements intact.     Pupils: Pupils are equal, round, and reactive to light.  Cardiovascular:     Rate and Rhythm: Normal rate and regular rhythm.  Pulmonary:     Breath sounds: Normal breath sounds.  Abdominal:     General: Bowel  sounds are normal.     Tenderness: There is abdominal tenderness. There is left CVA tenderness. There is no right CVA tenderness.     Comments: Negative Mcburney. Tenderness to deep palpation in LLQ  Musculoskeletal:     Cervical back: Normal range of motion. Tenderness present.  Neurological:     General: No focal deficit present.     Mental Status: She is alert and oriented to person, place, and time.     Sensory: No sensory deficit.     Motor: No weakness.     Gait: Gait normal.  Psychiatric:        Mood and Affect: Mood normal.        Behavior: Behavior normal.         Assessment & Plan:  Marland KitchenMarland KitchenHaleigh was seen today for cyst.  Diagnoses and all orders for this visit:  Left lower quadrant pain -     CBC w/Diff/Platelet -     COMPLETE METABOLIC PANEL WITH  GFR -     US Pelvic Complete With Transvaginal; Future -     POCT URINALYSIS DIP (CLINITEK) -     Urine Culture  Family history of ovarian cyst  Left flank pain -     CBC w/Diff/Platelet -     COMPLETE METABOLIC PANEL WITH GFR -     US Pelvic Complete With Transvaginal; Future -     POCT URINALYSIS DIP (CLINITEK) -     Urine Culture  Menorrhagia with regular cycle -     norethindrone-ethinyl estradiol-iron (JUNEL FE 1.5/30) 1.5-30 MG-MCG tablet; Take 1 tablet by mouth daily.  Acute nonintractable headache, unspecified headache type  Dizziness    UA positive for blood, no signs if UTI today.  Will culture Pelvic u/s tomorrow at 11am to look for ovarian cyst or free fluid with evidence of rupture Cbc/cmp ordered today if WBC elevated consider CT scan for lower abdominal pain to rule out diverticulitis,  Possible kidney stone but no urinary symptoms Pain is improving Needs pap, declined today due to pain and discomfort, refilled OCP, schedule on way out Discussed possible post concussion after fall Written out of work until Thursday and if has any headache/dizziness/nausea to go home Follow up as needed or if symptoms worsen or persist.

## 2022-07-05 ENCOUNTER — Ambulatory Visit (INDEPENDENT_AMBULATORY_CARE_PROVIDER_SITE_OTHER): Payer: BC Managed Care – PPO

## 2022-07-05 ENCOUNTER — Encounter: Payer: Self-pay | Admitting: Physician Assistant

## 2022-07-05 DIAGNOSIS — R102 Pelvic and perineal pain: Secondary | ICD-10-CM | POA: Diagnosis not present

## 2022-07-05 DIAGNOSIS — R109 Unspecified abdominal pain: Secondary | ICD-10-CM | POA: Diagnosis not present

## 2022-07-05 DIAGNOSIS — R1032 Left lower quadrant pain: Secondary | ICD-10-CM

## 2022-07-05 LAB — COMPLETE METABOLIC PANEL WITH GFR
AG Ratio: 1.9 (calc) (ref 1.0–2.5)
ALT: 11 U/L (ref 6–29)
AST: 12 U/L (ref 10–30)
Albumin: 4.3 g/dL (ref 3.6–5.1)
Alkaline phosphatase (APISO): 43 U/L (ref 31–125)
BUN: 19 mg/dL (ref 7–25)
CO2: 29 mmol/L (ref 20–32)
Calcium: 9.1 mg/dL (ref 8.6–10.2)
Chloride: 104 mmol/L (ref 98–110)
Creat: 0.57 mg/dL (ref 0.50–0.96)
Globulin: 2.3 g/dL (calc) (ref 1.9–3.7)
Glucose, Bld: 81 mg/dL (ref 65–99)
Potassium: 4.5 mmol/L (ref 3.5–5.3)
Sodium: 139 mmol/L (ref 135–146)
Total Bilirubin: 0.4 mg/dL (ref 0.2–1.2)
Total Protein: 6.6 g/dL (ref 6.1–8.1)
eGFR: 132 mL/min/{1.73_m2} (ref >=60)

## 2022-07-05 LAB — CBC WITH DIFFERENTIAL/PLATELET
Absolute Monocytes: 269 cells/uL (ref 200–950)
Basophils Absolute: 38 cells/uL (ref 0–200)
Basophils Relative: 0.8 %
Eosinophils Absolute: 451 cells/uL (ref 15–500)
Eosinophils Relative: 9.4 %
HCT: 39 % (ref 35.0–45.0)
Hemoglobin: 13.3 g/dL (ref 11.7–15.5)
Lymphs Abs: 1459 cells/uL (ref 850–3900)
MCH: 31.2 pg (ref 27.0–33.0)
MCHC: 34.1 g/dL (ref 32.0–36.0)
MCV: 91.5 fL (ref 80.0–100.0)
MPV: 10.2 fL (ref 7.5–12.5)
Monocytes Relative: 5.6 %
Neutro Abs: 2582 cells/uL (ref 1500–7800)
Neutrophils Relative %: 53.8 %
Platelets: 285 10*3/uL (ref 140–400)
RBC: 4.26 10*6/uL (ref 3.80–5.10)
RDW: 12 % (ref 11.0–15.0)
Total Lymphocyte: 30.4 %
WBC: 4.8 10*3/uL (ref 3.8–10.8)

## 2022-07-05 LAB — URINE CULTURE
MICRO NUMBER:: 14525829
SPECIMEN QUALITY:: ADEQUATE

## 2022-07-05 NOTE — Progress Notes (Signed)
No active cyst. You do have small cluster of follicles in the left ovary and trace free pelvic fluid which could represent ovarian rupture. How is your pain today?

## 2022-07-05 NOTE — Progress Notes (Signed)
Tomara,   WBC looks great. No sign of infection.  Glucose, kidney, liver look great.

## 2022-07-07 DIAGNOSIS — F331 Major depressive disorder, recurrent, moderate: Secondary | ICD-10-CM | POA: Diagnosis not present

## 2022-07-07 NOTE — Progress Notes (Signed)
No abnormal bacteria found in urine.

## 2022-07-21 DIAGNOSIS — F331 Major depressive disorder, recurrent, moderate: Secondary | ICD-10-CM | POA: Diagnosis not present

## 2022-07-28 DIAGNOSIS — F331 Major depressive disorder, recurrent, moderate: Secondary | ICD-10-CM | POA: Diagnosis not present

## 2022-08-02 ENCOUNTER — Other Ambulatory Visit: Payer: Self-pay | Admitting: Physician Assistant

## 2022-08-02 DIAGNOSIS — F411 Generalized anxiety disorder: Secondary | ICD-10-CM

## 2022-08-02 DIAGNOSIS — F410 Panic disorder [episodic paroxysmal anxiety] without agoraphobia: Secondary | ICD-10-CM

## 2022-08-02 DIAGNOSIS — F331 Major depressive disorder, recurrent, moderate: Secondary | ICD-10-CM

## 2022-08-02 DIAGNOSIS — R632 Polyphagia: Secondary | ICD-10-CM

## 2022-08-02 DIAGNOSIS — F39 Unspecified mood [affective] disorder: Secondary | ICD-10-CM

## 2022-08-04 DIAGNOSIS — F331 Major depressive disorder, recurrent, moderate: Secondary | ICD-10-CM | POA: Diagnosis not present

## 2022-08-11 DIAGNOSIS — F331 Major depressive disorder, recurrent, moderate: Secondary | ICD-10-CM | POA: Diagnosis not present

## 2022-08-17 ENCOUNTER — Other Ambulatory Visit: Payer: Self-pay | Admitting: Physician Assistant

## 2022-08-17 DIAGNOSIS — J301 Allergic rhinitis due to pollen: Secondary | ICD-10-CM

## 2022-08-17 DIAGNOSIS — F331 Major depressive disorder, recurrent, moderate: Secondary | ICD-10-CM | POA: Diagnosis not present

## 2022-08-25 DIAGNOSIS — F331 Major depressive disorder, recurrent, moderate: Secondary | ICD-10-CM | POA: Diagnosis not present

## 2022-08-29 NOTE — Telephone Encounter (Signed)
At provider's request - called the patient regarding overdue PAP exam. No answer, left a brief vm msg to return a call back directly to the clinic to schedule their PAP. Direct call back info provided.

## 2022-09-01 DIAGNOSIS — F331 Major depressive disorder, recurrent, moderate: Secondary | ICD-10-CM | POA: Diagnosis not present

## 2022-09-06 DIAGNOSIS — F331 Major depressive disorder, recurrent, moderate: Secondary | ICD-10-CM | POA: Diagnosis not present

## 2022-09-29 DIAGNOSIS — F331 Major depressive disorder, recurrent, moderate: Secondary | ICD-10-CM | POA: Diagnosis not present

## 2022-10-05 DIAGNOSIS — F331 Major depressive disorder, recurrent, moderate: Secondary | ICD-10-CM | POA: Diagnosis not present

## 2022-10-10 ENCOUNTER — Other Ambulatory Visit: Payer: Self-pay | Admitting: Physician Assistant

## 2022-10-10 DIAGNOSIS — N920 Excessive and frequent menstruation with regular cycle: Secondary | ICD-10-CM

## 2022-10-10 DIAGNOSIS — F331 Major depressive disorder, recurrent, moderate: Secondary | ICD-10-CM | POA: Diagnosis not present

## 2022-10-17 DIAGNOSIS — F429 Obsessive-compulsive disorder, unspecified: Secondary | ICD-10-CM | POA: Diagnosis not present

## 2022-10-24 DIAGNOSIS — F429 Obsessive-compulsive disorder, unspecified: Secondary | ICD-10-CM | POA: Diagnosis not present

## 2022-10-27 ENCOUNTER — Other Ambulatory Visit: Payer: Self-pay | Admitting: Physician Assistant

## 2022-10-27 DIAGNOSIS — F331 Major depressive disorder, recurrent, moderate: Secondary | ICD-10-CM

## 2022-10-27 DIAGNOSIS — F39 Unspecified mood [affective] disorder: Secondary | ICD-10-CM

## 2022-10-27 DIAGNOSIS — R632 Polyphagia: Secondary | ICD-10-CM

## 2022-10-27 DIAGNOSIS — F411 Generalized anxiety disorder: Secondary | ICD-10-CM

## 2022-10-27 DIAGNOSIS — F410 Panic disorder [episodic paroxysmal anxiety] without agoraphobia: Secondary | ICD-10-CM

## 2022-10-31 DIAGNOSIS — F429 Obsessive-compulsive disorder, unspecified: Secondary | ICD-10-CM | POA: Diagnosis not present

## 2022-11-10 DIAGNOSIS — F429 Obsessive-compulsive disorder, unspecified: Secondary | ICD-10-CM | POA: Diagnosis not present

## 2022-11-11 ENCOUNTER — Other Ambulatory Visit: Payer: Self-pay | Admitting: Physician Assistant

## 2022-11-11 DIAGNOSIS — J301 Allergic rhinitis due to pollen: Secondary | ICD-10-CM

## 2022-11-14 ENCOUNTER — Encounter: Payer: Self-pay | Admitting: Physician Assistant

## 2022-11-14 ENCOUNTER — Other Ambulatory Visit (HOSPITAL_COMMUNITY)
Admission: RE | Admit: 2022-11-14 | Discharge: 2022-11-14 | Disposition: A | Discharge status: Home / Self Care | Payer: BC Managed Care – PPO | Source: Ambulatory Visit | Attending: Physician Assistant | Admitting: Physician Assistant

## 2022-11-14 ENCOUNTER — Ambulatory Visit (INDEPENDENT_AMBULATORY_CARE_PROVIDER_SITE_OTHER): Payer: BC Managed Care – PPO | Admitting: Physician Assistant

## 2022-11-14 VITALS — BP 118/63 | HR 79 | Ht <= 58 in | Wt 137.0 lb

## 2022-11-14 DIAGNOSIS — F411 Generalized anxiety disorder: Secondary | ICD-10-CM

## 2022-11-14 DIAGNOSIS — R632 Polyphagia: Secondary | ICD-10-CM

## 2022-11-14 DIAGNOSIS — F410 Panic disorder [episodic paroxysmal anxiety] without agoraphobia: Secondary | ICD-10-CM | POA: Diagnosis not present

## 2022-11-14 DIAGNOSIS — Z13220 Encounter for screening for lipoid disorders: Secondary | ICD-10-CM

## 2022-11-14 DIAGNOSIS — Z131 Encounter for screening for diabetes mellitus: Secondary | ICD-10-CM | POA: Diagnosis not present

## 2022-11-14 DIAGNOSIS — Z1159 Encounter for screening for other viral diseases: Secondary | ICD-10-CM

## 2022-11-14 DIAGNOSIS — F39 Unspecified mood [affective] disorder: Secondary | ICD-10-CM | POA: Diagnosis not present

## 2022-11-14 DIAGNOSIS — J301 Allergic rhinitis due to pollen: Secondary | ICD-10-CM

## 2022-11-14 DIAGNOSIS — Z91030 Bee allergy status: Secondary | ICD-10-CM | POA: Insufficient documentation

## 2022-11-14 DIAGNOSIS — Z124 Encounter for screening for malignant neoplasm of cervix: Secondary | ICD-10-CM

## 2022-11-14 DIAGNOSIS — Z113 Encounter for screening for infections with a predominantly sexual mode of transmission: Secondary | ICD-10-CM

## 2022-11-14 DIAGNOSIS — N920 Excessive and frequent menstruation with regular cycle: Secondary | ICD-10-CM

## 2022-11-14 DIAGNOSIS — Z0000 Encounter for general adult medical examination without abnormal findings: Secondary | ICD-10-CM

## 2022-11-14 DIAGNOSIS — F331 Major depressive disorder, recurrent, moderate: Secondary | ICD-10-CM

## 2022-11-14 MED ORDER — NORETHIN ACE-ETH ESTRAD-FE 1.5-30 MG-MCG PO TABS
1.0000 | ORAL_TABLET | Freq: Every day | ORAL | 3 refills | Status: DC
Start: 2022-11-14 — End: 2024-02-04

## 2022-11-14 MED ORDER — EPINEPHRINE 0.3 MG/0.3ML IJ SOAJ
0.3000 mg | Freq: Once | INTRAMUSCULAR | 1 refills | Status: AC | PRN
Start: 2022-11-14 — End: ?

## 2022-11-14 MED ORDER — MONTELUKAST SODIUM 10 MG PO TABS
10.0000 mg | ORAL_TABLET | Freq: Every day | ORAL | 3 refills | Status: DC
Start: 2022-11-14 — End: 2023-11-19

## 2022-11-14 MED ORDER — CITALOPRAM HYDROBROMIDE 40 MG PO TABS
40.0000 mg | ORAL_TABLET | Freq: Every day | ORAL | 3 refills | Status: DC
Start: 2022-11-14 — End: 2023-11-16

## 2022-11-14 MED ORDER — CLONAZEPAM 0.5 MG PO TABS
ORAL_TABLET | ORAL | 0 refills | Status: DC
Start: 2022-11-14 — End: 2023-11-19

## 2022-11-14 MED ORDER — LAMOTRIGINE 100 MG PO TABS
100.0000 mg | ORAL_TABLET | Freq: Every day | ORAL | 3 refills | Status: DC
Start: 2022-11-14 — End: 2023-11-19

## 2022-11-14 MED ORDER — BUPROPION HCL ER (XL) 150 MG PO TB24
150.0000 mg | ORAL_TABLET | Freq: Every day | ORAL | 3 refills | Status: DC
Start: 2022-11-14 — End: 2023-11-19

## 2022-11-14 NOTE — Patient Instructions (Signed)

## 2022-11-14 NOTE — Progress Notes (Signed)
Complete physical exam  Patient: Lauren Zamora   DOB: 31-May-2000   21 y.o. Female  MRN: [id number]  Subjective:    Chief Complaint  Patient presents with   Annual Exam   Gynecologic Exam    Jimenna Interiano is a 22 y.o. female who presents today for a complete physical exam. She reports consuming a general diet. The patient does not participate in regular exercise at present. She generally feels well. She reports sleeping well. She does not have additional problems to discuss today.    Most recent fall risk assessment:    11/14/2022   10:32 AM  Fall Risk   Falls in the past year? 1  Number falls in past yr: 0  Injury with Fall? 1  Risk for fall due to : History of fall(s)  Follow up Falls evaluation completed     Most recent depression screenings:    11/14/2022   10:33 AM 07/04/2022   10:01 AM  PHQ 2/9 Scores  PHQ - 2 Score 4 5    Vision:Within last year, Dental: No current dental problems and Receives regular dental care, and STD: The patient denies history of sexually transmitted disease.  Patient Active Problem List   Diagnosis Date Noted   Bee sting allergy 11/14/2022   Low iron stores 08/15/2021   Low serum vitamin B12 08/15/2021   Borderline personality disorder (HCC) 07/16/2020   Binge eating 01/04/2020   Chest pain 07/07/2019   Moderate episode of recurrent major depressive disorder (HCC) 04/16/2019   Mood disorder (HCC) 04/16/2019   Deliberate self-cutting 04/16/2019   Folic acid deficiency 10/03/2017   Panic attacks 10/02/2017   Weight gain 10/02/2017   No energy 10/02/2017   Persistent headaches 07/06/2017   Post concussion syndrome 07/06/2017   Low folate 12/19/2016   Dysmenorrhea in adolescent 12/13/2016   Menorrhagia with regular cycle 12/13/2016   Non-seasonal allergic rhinitis 11/30/2016   GAD (generalized anxiety disorder) 08/11/2015   Asthma, mild intermittent 08/11/2015   Past Medical History:  Diagnosis Date   Asthma    Asthma, mild  intermittent 08/11/2015   GAD (generalized anxiety disorder) 08/11/2015   GAD7 score 3 11/20/16   Pneumonitis 11/20/2016   Past Surgical History:  Procedure Laterality Date   NO PAST SURGERIES     Family History  Problem Relation Age of Onset   Anxiety disorder Mother    Depression Mother    Parkinson's disease Maternal Grandmother    Stroke Paternal Grandfather    Bipolar disorder Maternal Aunt    Allergies  Allergen Reactions   Bee Venom    Lavender Oil Anaphylaxis   Tea Anaphylaxis    Jasmine tea      Patient Care Team: Nolene Ebbs as PCP - General (Family Medicine)   Outpatient Medications Prior to Visit  Medication Sig   albuterol (VENTOLIN HFA) 108 (90 Base) MCG/ACT inhaler Inhale 2 puffs into the lungs every 6 (six) hours as needed for wheezing or shortness of breath.   cetirizine (ZYRTEC) 10 MG tablet Take 10 mg by mouth daily.   [DISCONTINUED] buPROPion (WELLBUTRIN XL) 150 MG 24 hr tablet TAKE 1 TABLET BY MOUTH EVERY DAY   [DISCONTINUED] citalopram (CELEXA) 40 MG tablet TAKE 1 TABLET BY MOUTH EVERY DAY   [DISCONTINUED] clonazePAM (KLONOPIN) 0.5 MG tablet Take no more than daily as needed for panic attack.   [DISCONTINUED] EPINEPHrine 0.3 mg/0.3 mL IJ SOAJ injection Inject 0.3 mg into the muscle once as needed.   [DISCONTINUED]  JUNEL FE 1.5/30 1.5-30 MG-MCG tablet TAKE 1 TABLET BY MOUTH EVERY DAY   [DISCONTINUED] lamoTRIgine (LAMICTAL) 100 MG tablet TAKE 1 TABLET BY MOUTH EVERY DAY   [DISCONTINUED] montelukast (SINGULAIR) 10 MG tablet TAKE 1 TABLET (10 MG TOTAL) BY MOUTH AT BEDTIME. APPT FOR FURTHER REFILLS   No facility-administered medications prior to visit.    ROS        Objective:     BP 118/63 (BP Location: Right Arm, Patient Position: Sitting, Cuff Size: Normal)   Pulse 79   Ht 4\' 8"  (1.422 m)   Wt 137 lb (62.1 kg)   SpO2 95%   BMI 30.71 kg/m  BP Readings from Last 3 Encounters:  11/14/22 118/63  07/04/22 122/88  04/11/22 123/75    Wt Readings from Last 3 Encounters:  11/14/22 137 lb (62.1 kg)  07/04/22 140 lb (63.5 kg)  04/11/22 138 lb (62.6 kg)      Physical Exam  BP 118/63 (BP Location: Right Arm, Patient Position: Sitting, Cuff Size: Normal)   Pulse 79   Ht 4\' 8"  (1.422 m)   Wt 137 lb (62.1 kg)   SpO2 95%   BMI 30.71 kg/m   General Appearance:    Alert, cooperative, no distress, appears stated age  Head:    Normocephalic, without obvious abnormality, atraumatic  Eyes:    PERRL, conjunctiva/corneas clear, EOM's intact, fundi    benign, both eyes  Ears:    Normal TM's and external ear canals, both ears  Nose:   Nares normal, septum midline, mucosa normal, no drainage    or sinus tenderness  Throat:   Lips, mucosa, and tongue normal; teeth and gums normal  Neck:   Supple, symmetrical, trachea midline, no adenopathy;    thyroid:  no enlargement/tenderness/nodules; no carotid   bruit or JVD  Back:     Symmetric, no curvature, ROM normal, no CVA tenderness  Lungs:     Clear to auscultation bilaterally, respirations unlabored  Chest Wall:    No tenderness or deformity   Heart:    Regular rate and rhythm, S1 and S2 normal, no murmur, rub   or gallop  Breast Exam:    No tenderness, masses, or nipple abnormality  Abdomen:     Soft, non-tender, bowel sounds active all four quadrants,    no masses, no organomegaly  Genitalia:    Normal female without lesion, discharge or tenderness  Rectal:    Normal tone,  Extremities:   Extremities normal, atraumatic, no cyanosis or edema  Pulses:   2+ and symmetric all extremities  Skin:   Skin color, texture, turgor normal, no rashes or lesions  Lymph nodes:   Cervical, supraclavicular, and axillary nodes normal  Neurologic:   CNII-XII intact, normal strength, sensation and reflexes    throughout  .Marland Kitchen    11/14/2022   10:33 AM 07/04/2022   10:01 AM 08/12/2021    1:16 PM 07/16/2020   11:40 AM 12/31/2019   10:32 AM  Depression screen PHQ 2/9  Decreased Interest 2 3 2 3 3    Down, Depressed, Hopeless 2 2 3 3 3   PHQ - 2 Score 4 5 5 6 6   Altered sleeping   3 3 3   Tired, decreased energy   3 3 3   Change in appetite   3 3 3   Feeling bad or failure about yourself    3 3 2   Trouble concentrating   1 2 3   Moving slowly or fidgety/restless   3 2  3  Suicidal thoughts   0 2 1  PHQ-9 Score   21 24 24   Difficult doing work/chores   Extremely dIfficult Extremely dIfficult Extremely dIfficult   .Marland Kitchen    08/12/2021    1:16 PM 07/16/2020   11:41 AM 12/31/2019   10:33 AM 12/03/2019   10:59 AM  GAD 7 : Generalized Anxiety Score  Nervous, Anxious, on Edge 3 3 3 3   Control/stop worrying 3 3 3 3   Worry too much - different things 3 3 3 3   Trouble relaxing 3 3 3 3   Restless 1 1 3 3   Easily annoyed or irritable 3 3 3 3   Afraid - awful might happen 3 3 3 3   Total GAD 7 Score 19 19 21 21   Anxiety Difficulty Extremely difficult Extremely difficult Extremely difficult Extremely difficult        Assessment & Plan:    Routine Health Maintenance and Physical Exam  Immunization History  Administered Date(s) Administered   DTaP 05/08/2000, 07/17/2000, 09/11/2000, 09/10/2001, 03/17/2004   HIB (PRP-OMP) 05/08/2000, 07/17/2000, 09/11/2000, 03/27/2001   HIB, Unspecified 05/08/2000, 07/17/2000, 09/11/2000, 03/27/2001   HPV 9-valent 08/10/2015   HPV Quadrivalent 12/19/2012, 02/26/2013   Hepatitis A, Ped/Adol-2 Dose 03/04/2008, 05/18/2010   Hepatitis B, PED/ADOLESCENT Mar 21, 2000, 04/10/2000, 09/11/2000   IPV 05/08/2000, 07/17/2000, 03/27/2001, 03/17/2004   Influenza, Quadrivalent, Recombinant, Inj, Pf 03/02/2019   Influenza,inj,Quad PF,6+ Mos 02/21/2020, 01/29/2021, 02/21/2022   Influenza,inj,quad, With Preservative 03/04/2008, 03/24/2009, 04/12/2010   Influenza-Unspecified 03/04/2008, 03/24/2009, 04/12/2010, 04/10/2012, 04/28/2017   Janssen (J&J) SARS-COV-2 Vaccination 09/04/2019   MMR 03/27/2001, 03/17/2004   Meningococcal B, OMV 12/13/2016, 01/10/2017   Meningococcal  Conjugate 06/15/2011   Meningococcal Mcv4o 12/13/2016   Pneumococcal Conjugate-13 05/08/2000, 07/17/2000, 09/11/2000, 03/10/2002   Tdap 06/15/2011, 08/12/2021   Varicella 03/27/2001, 07/13/2005    Health Maintenance  Topic Date Due   HIV Screening  Never done   Hepatitis C Screening  Never done   PAP SMEAR-Modifier  Never done   COVID-19 Vaccine (2 - Janssen risk series) 11/30/2022 (Originally 10/02/2019)   PAP-Cervical Cytology Screening  02/22/2023 (Originally 03/07/2021)   INFLUENZA VACCINE  12/28/2022   DTaP/Tdap/Td (8 - Td or Tdap) 08/13/2031   HPV VACCINES  Completed    Discussed health benefits of physical activity, and encouraged her to engage in regular exercise appropriate for her age and condition. Marland KitchenCinda Quest was seen today for annual exam and gynecologic exam.  Diagnoses and all orders for this visit:  Routine physical examination -     TSH -     Lipid Panel w/reflex Direct LDL -     COMPLETE METABOLIC PANEL WITH GFR -     CBC with Differential/Platelet -     Hepatitis C Antibody -     HIV antibody (with reflex) -     RPR  Mood disorder (HCC) -     citalopram (CELEXA) 40 MG tablet; Take 1 tablet (40 mg total) by mouth daily. -     lamoTRIgine (LAMICTAL) 100 MG tablet; Take 1 tablet (100 mg total) by mouth daily.  Panic attacks -     citalopram (CELEXA) 40 MG tablet; Take 1 tablet (40 mg total) by mouth daily. -     clonazePAM (KLONOPIN) 0.5 MG tablet; Take no more than daily as needed for panic attack.  GAD (generalized anxiety disorder) -     citalopram (CELEXA) 40 MG tablet; Take 1 tablet (40 mg total) by mouth daily. -     buPROPion (WELLBUTRIN XL) 150 MG 24  hr tablet; Take 1 tablet (150 mg total) by mouth daily. -     lamoTRIgine (LAMICTAL) 100 MG tablet; Take 1 tablet (100 mg total) by mouth daily.  Moderate episode of recurrent major depressive disorder (HCC) -     citalopram (CELEXA) 40 MG tablet; Take 1 tablet (40 mg total) by mouth daily. -      buPROPion (WELLBUTRIN XL) 150 MG 24 hr tablet; Take 1 tablet (150 mg total) by mouth daily. -     lamoTRIgine (LAMICTAL) 100 MG tablet; Take 1 tablet (100 mg total) by mouth daily.  Binge eating -     buPROPion (WELLBUTRIN XL) 150 MG 24 hr tablet; Take 1 tablet (150 mg total) by mouth daily.  Non-seasonal allergic rhinitis due to pollen -     montelukast (SINGULAIR) 10 MG tablet; Take 1 tablet (10 mg total) by mouth at bedtime.  Menorrhagia with regular cycle -     norethindrone-ethinyl estradiol-iron (JUNEL FE 1.5/30) 1.5-30 MG-MCG tablet; Take 1 tablet by mouth daily.  Encounter for hepatitis C screening test for low risk patient -     Hepatitis C Antibody  Routine screening for STI (sexually transmitted infection) -     Hepatitis C Antibody -     HIV antibody (with reflex) -     RPR -     Cytology - PAP  Screening for diabetes mellitus -     COMPLETE METABOLIC PANEL WITH GFR  Screening for lipid disorders -     Lipid Panel w/reflex Direct LDL  Screening for cervical cancer -     Cytology - PAP  Bee sting allergy -     EPINEPHrine 0.3 mg/0.3 mL IJ SOAJ injection; Inject 0.3 mg into the muscle once as needed.   .. Discussed 150 minutes of exercise a week.  Encouraged vitamin D 1000 units and Calcium 1300mg  or 4 servings of dairy a day.  Fasting labs ordered PHQ/GAD not to goal but patient does not want any medication changes Continue weekly counseling Refills sent Pap with STI done today OCP refilled Epi pen refilled  Return in about 1 year (around 11/14/2023), or if symptoms worsen or fail to improve.     Tandy Gaw, PA-C

## 2022-11-15 DIAGNOSIS — F429 Obsessive-compulsive disorder, unspecified: Secondary | ICD-10-CM | POA: Diagnosis not present

## 2022-11-15 LAB — COMPLETE METABOLIC PANEL WITH GFR
AG Ratio: 2 (calc) (ref 1.0–2.5)
ALT: 16 U/L (ref 6–29)
AST: 13 U/L (ref 10–30)
Albumin: 4.3 g/dL (ref 3.6–5.1)
Alkaline phosphatase (APISO): 48 U/L (ref 31–125)
BUN: 21 mg/dL (ref 7–25)
CO2: 27 mmol/L (ref 20–32)
Calcium: 9.4 mg/dL (ref 8.6–10.2)
Chloride: 100 mmol/L (ref 98–110)
Creat: 0.66 mg/dL (ref 0.50–0.96)
Globulin: 2.2 g/dL (calc) (ref 1.9–3.7)
Glucose, Bld: 73 mg/dL (ref 65–99)
Potassium: 4.4 mmol/L (ref 3.5–5.3)
Sodium: 137 mmol/L (ref 135–146)
Total Bilirubin: 0.3 mg/dL (ref 0.2–1.2)
Total Protein: 6.5 g/dL (ref 6.1–8.1)
eGFR: 127 mL/min/{1.73_m2} (ref >=60)

## 2022-11-15 LAB — CBC WITH DIFFERENTIAL/PLATELET
Absolute Monocytes: 292 cells/uL (ref 200–950)
Basophils Absolute: 32 cells/uL (ref 0–200)
Basophils Relative: 0.6 %
Eosinophils Absolute: 307 cells/uL (ref 15–500)
Eosinophils Relative: 5.8 %
HCT: 42.3 % (ref 35.0–45.0)
Hemoglobin: 13.9 g/dL (ref 11.7–15.5)
Lymphs Abs: 1182 cells/uL (ref 850–3900)
MCH: 30.3 pg (ref 27.0–33.0)
MCHC: 32.9 g/dL (ref 32.0–36.0)
MCV: 92.4 fL (ref 80.0–100.0)
MPV: 9.7 fL (ref 7.5–12.5)
Monocytes Relative: 5.5 %
Neutro Abs: 3487 cells/uL (ref 1500–7800)
Neutrophils Relative %: 65.8 %
Platelets: 312 10*3/uL (ref 140–400)
RBC: 4.58 10*6/uL (ref 3.80–5.10)
RDW: 12.3 % (ref 11.0–15.0)
Total Lymphocyte: 22.3 %
WBC: 5.3 10*3/uL (ref 3.8–10.8)

## 2022-11-15 LAB — LIPID PANEL W/REFLEX DIRECT LDL
Cholesterol: 185 mg/dL (ref <200)
HDL: 72 mg/dL (ref >=50)
LDL Cholesterol (Calc): 92 mg/dL (calc)
Non-HDL Cholesterol (Calc): 113 mg/dL (calc) (ref <130)
Total CHOL/HDL Ratio: 2.6 (calc) (ref <5.0)
Triglycerides: 112 mg/dL (ref <150)

## 2022-11-15 LAB — CYTOLOGY - PAP
Chlamydia: NEGATIVE
Comment: NEGATIVE
Comment: NEGATIVE
Comment: NORMAL
Diagnosis: NEGATIVE
High risk HPV: NEGATIVE
Neisseria Gonorrhea: NEGATIVE

## 2022-11-15 LAB — HIV ANTIBODY (ROUTINE TESTING W REFLEX): HIV 1&2 Ab, 4th Generation: NONREACTIVE

## 2022-11-15 LAB — HEPATITIS C ANTIBODY: Hepatitis C Ab: NONREACTIVE

## 2022-11-15 LAB — RPR: RPR Ser Ql: NONREACTIVE

## 2022-11-15 LAB — TSH: TSH: 1.72 mIU/L

## 2022-11-15 NOTE — Progress Notes (Signed)
Thyroid looks good.  Cholesterol looks great.  Kidney, liver, glucose looks great.  No anemia.

## 2022-11-17 NOTE — Progress Notes (Signed)
No STDs. No abnormal cells. Follow up in 3 years.

## 2022-11-24 DIAGNOSIS — F429 Obsessive-compulsive disorder, unspecified: Secondary | ICD-10-CM | POA: Diagnosis not present

## 2022-12-01 DIAGNOSIS — F429 Obsessive-compulsive disorder, unspecified: Secondary | ICD-10-CM | POA: Diagnosis not present

## 2022-12-07 DIAGNOSIS — F429 Obsessive-compulsive disorder, unspecified: Secondary | ICD-10-CM | POA: Diagnosis not present

## 2022-12-15 DIAGNOSIS — F429 Obsessive-compulsive disorder, unspecified: Secondary | ICD-10-CM | POA: Diagnosis not present

## 2022-12-22 DIAGNOSIS — F429 Obsessive-compulsive disorder, unspecified: Secondary | ICD-10-CM | POA: Diagnosis not present

## 2022-12-25 ENCOUNTER — Ambulatory Visit: Payer: BC Managed Care – PPO | Admitting: Physician Assistant

## 2022-12-29 DIAGNOSIS — F429 Obsessive-compulsive disorder, unspecified: Secondary | ICD-10-CM | POA: Diagnosis not present

## 2023-01-03 DIAGNOSIS — F429 Obsessive-compulsive disorder, unspecified: Secondary | ICD-10-CM | POA: Diagnosis not present

## 2023-01-11 DIAGNOSIS — F429 Obsessive-compulsive disorder, unspecified: Secondary | ICD-10-CM | POA: Diagnosis not present

## 2023-01-19 DIAGNOSIS — F429 Obsessive-compulsive disorder, unspecified: Secondary | ICD-10-CM | POA: Diagnosis not present

## 2023-01-23 DIAGNOSIS — F429 Obsessive-compulsive disorder, unspecified: Secondary | ICD-10-CM | POA: Diagnosis not present

## 2023-02-02 DIAGNOSIS — F429 Obsessive-compulsive disorder, unspecified: Secondary | ICD-10-CM | POA: Diagnosis not present

## 2023-02-09 DIAGNOSIS — F429 Obsessive-compulsive disorder, unspecified: Secondary | ICD-10-CM | POA: Diagnosis not present

## 2023-02-13 DIAGNOSIS — F429 Obsessive-compulsive disorder, unspecified: Secondary | ICD-10-CM | POA: Diagnosis not present

## 2023-02-22 DIAGNOSIS — F429 Obsessive-compulsive disorder, unspecified: Secondary | ICD-10-CM | POA: Diagnosis not present

## 2023-02-28 DIAGNOSIS — F429 Obsessive-compulsive disorder, unspecified: Secondary | ICD-10-CM | POA: Diagnosis not present

## 2023-03-06 DIAGNOSIS — F429 Obsessive-compulsive disorder, unspecified: Secondary | ICD-10-CM | POA: Diagnosis not present

## 2023-03-13 DIAGNOSIS — F429 Obsessive-compulsive disorder, unspecified: Secondary | ICD-10-CM | POA: Diagnosis not present

## 2023-03-20 DIAGNOSIS — F429 Obsessive-compulsive disorder, unspecified: Secondary | ICD-10-CM | POA: Diagnosis not present

## 2023-03-27 DIAGNOSIS — F429 Obsessive-compulsive disorder, unspecified: Secondary | ICD-10-CM | POA: Diagnosis not present

## 2023-04-04 DIAGNOSIS — F429 Obsessive-compulsive disorder, unspecified: Secondary | ICD-10-CM | POA: Diagnosis not present

## 2023-04-10 DIAGNOSIS — F429 Obsessive-compulsive disorder, unspecified: Secondary | ICD-10-CM | POA: Diagnosis not present

## 2023-04-20 DIAGNOSIS — F429 Obsessive-compulsive disorder, unspecified: Secondary | ICD-10-CM | POA: Diagnosis not present

## 2023-04-30 DIAGNOSIS — F429 Obsessive-compulsive disorder, unspecified: Secondary | ICD-10-CM | POA: Diagnosis not present

## 2023-05-07 DIAGNOSIS — F429 Obsessive-compulsive disorder, unspecified: Secondary | ICD-10-CM | POA: Diagnosis not present

## 2023-05-14 DIAGNOSIS — F429 Obsessive-compulsive disorder, unspecified: Secondary | ICD-10-CM | POA: Diagnosis not present

## 2023-05-24 DIAGNOSIS — F429 Obsessive-compulsive disorder, unspecified: Secondary | ICD-10-CM | POA: Diagnosis not present

## 2023-06-01 DIAGNOSIS — F429 Obsessive-compulsive disorder, unspecified: Secondary | ICD-10-CM | POA: Diagnosis not present

## 2023-06-04 DIAGNOSIS — F429 Obsessive-compulsive disorder, unspecified: Secondary | ICD-10-CM | POA: Diagnosis not present

## 2023-06-11 DIAGNOSIS — F429 Obsessive-compulsive disorder, unspecified: Secondary | ICD-10-CM | POA: Diagnosis not present

## 2023-06-19 DIAGNOSIS — F429 Obsessive-compulsive disorder, unspecified: Secondary | ICD-10-CM | POA: Diagnosis not present

## 2023-06-29 DIAGNOSIS — F429 Obsessive-compulsive disorder, unspecified: Secondary | ICD-10-CM | POA: Diagnosis not present

## 2023-07-03 DIAGNOSIS — F429 Obsessive-compulsive disorder, unspecified: Secondary | ICD-10-CM | POA: Diagnosis not present

## 2023-07-10 DIAGNOSIS — F429 Obsessive-compulsive disorder, unspecified: Secondary | ICD-10-CM | POA: Diagnosis not present

## 2023-07-16 DIAGNOSIS — F429 Obsessive-compulsive disorder, unspecified: Secondary | ICD-10-CM | POA: Diagnosis not present

## 2023-07-20 DIAGNOSIS — F429 Obsessive-compulsive disorder, unspecified: Secondary | ICD-10-CM | POA: Diagnosis not present

## 2023-07-22 DIAGNOSIS — T7840XA Allergy, unspecified, initial encounter: Secondary | ICD-10-CM | POA: Diagnosis not present

## 2023-07-24 DIAGNOSIS — F429 Obsessive-compulsive disorder, unspecified: Secondary | ICD-10-CM | POA: Diagnosis not present

## 2023-07-31 DIAGNOSIS — F429 Obsessive-compulsive disorder, unspecified: Secondary | ICD-10-CM | POA: Diagnosis not present

## 2023-08-10 DIAGNOSIS — F429 Obsessive-compulsive disorder, unspecified: Secondary | ICD-10-CM | POA: Diagnosis not present

## 2023-08-17 DIAGNOSIS — F429 Obsessive-compulsive disorder, unspecified: Secondary | ICD-10-CM | POA: Diagnosis not present

## 2023-08-21 DIAGNOSIS — F429 Obsessive-compulsive disorder, unspecified: Secondary | ICD-10-CM | POA: Diagnosis not present

## 2023-08-27 DIAGNOSIS — F429 Obsessive-compulsive disorder, unspecified: Secondary | ICD-10-CM | POA: Diagnosis not present

## 2023-09-03 DIAGNOSIS — F429 Obsessive-compulsive disorder, unspecified: Secondary | ICD-10-CM | POA: Diagnosis not present

## 2023-09-10 DIAGNOSIS — F429 Obsessive-compulsive disorder, unspecified: Secondary | ICD-10-CM | POA: Diagnosis not present

## 2023-09-18 DIAGNOSIS — F429 Obsessive-compulsive disorder, unspecified: Secondary | ICD-10-CM | POA: Diagnosis not present

## 2023-09-25 DIAGNOSIS — F429 Obsessive-compulsive disorder, unspecified: Secondary | ICD-10-CM | POA: Diagnosis not present

## 2023-10-02 DIAGNOSIS — F429 Obsessive-compulsive disorder, unspecified: Secondary | ICD-10-CM | POA: Diagnosis not present

## 2023-10-10 DIAGNOSIS — F429 Obsessive-compulsive disorder, unspecified: Secondary | ICD-10-CM | POA: Diagnosis not present

## 2023-10-17 DIAGNOSIS — F429 Obsessive-compulsive disorder, unspecified: Secondary | ICD-10-CM | POA: Diagnosis not present

## 2023-10-30 DIAGNOSIS — F429 Obsessive-compulsive disorder, unspecified: Secondary | ICD-10-CM | POA: Diagnosis not present

## 2023-11-08 DIAGNOSIS — F429 Obsessive-compulsive disorder, unspecified: Secondary | ICD-10-CM | POA: Diagnosis not present

## 2023-11-14 DIAGNOSIS — F429 Obsessive-compulsive disorder, unspecified: Secondary | ICD-10-CM | POA: Diagnosis not present

## 2023-11-16 ENCOUNTER — Ambulatory Visit (INDEPENDENT_AMBULATORY_CARE_PROVIDER_SITE_OTHER): Admitting: Physician Assistant

## 2023-11-16 ENCOUNTER — Encounter: Payer: Self-pay | Admitting: Physician Assistant

## 2023-11-16 VITALS — BP 132/84 | HR 75 | Ht <= 58 in | Wt 138.0 lb

## 2023-11-16 DIAGNOSIS — F39 Unspecified mood [affective] disorder: Secondary | ICD-10-CM | POA: Diagnosis not present

## 2023-11-16 DIAGNOSIS — F411 Generalized anxiety disorder: Secondary | ICD-10-CM

## 2023-11-16 DIAGNOSIS — Z131 Encounter for screening for diabetes mellitus: Secondary | ICD-10-CM

## 2023-11-16 DIAGNOSIS — Z6830 Body mass index (BMI) 30.0-30.9, adult: Secondary | ICD-10-CM

## 2023-11-16 DIAGNOSIS — J4520 Mild intermittent asthma, uncomplicated: Secondary | ICD-10-CM

## 2023-11-16 DIAGNOSIS — E6609 Other obesity due to excess calories: Secondary | ICD-10-CM

## 2023-11-16 DIAGNOSIS — Z23 Encounter for immunization: Secondary | ICD-10-CM

## 2023-11-16 DIAGNOSIS — F331 Major depressive disorder, recurrent, moderate: Secondary | ICD-10-CM | POA: Diagnosis not present

## 2023-11-16 DIAGNOSIS — Z0000 Encounter for general adult medical examination without abnormal findings: Secondary | ICD-10-CM

## 2023-11-16 DIAGNOSIS — F410 Panic disorder [episodic paroxysmal anxiety] without agoraphobia: Secondary | ICD-10-CM | POA: Diagnosis not present

## 2023-11-16 DIAGNOSIS — E66811 Obesity, class 1: Secondary | ICD-10-CM

## 2023-11-16 MED ORDER — OLANZAPINE-FLUOXETINE HCL 3-25 MG PO CAPS
1.0000 | ORAL_CAPSULE | Freq: Every evening | ORAL | 1 refills | Status: DC
Start: 2023-11-16 — End: 2023-12-21

## 2023-11-16 NOTE — Patient Instructions (Addendum)
 Cut celexa  in half for 7 days then start Symbyax at bedtime.   Health Maintenance, Female Adopting a healthy lifestyle and getting preventive care are important in promoting health and wellness. Ask your health care provider about: The right schedule for you to have regular tests and exams. Things you can do on your own to prevent diseases and keep yourself healthy. What should I know about diet, weight, and exercise? Eat a healthy diet  Eat a diet that includes plenty of vegetables, fruits, low-fat dairy products, and lean protein. Do not eat a lot of foods that are high in solid fats, added sugars, or sodium. Maintain a healthy weight Body mass index (BMI) is used to identify weight problems. It estimates body fat based on height and weight. Your health care provider can help determine your BMI and help you achieve or maintain a healthy weight. Get regular exercise Get regular exercise. This is one of the most important things you can do for your health. Most adults should: Exercise for at least 150 minutes each week. The exercise should increase your heart rate and make you sweat (moderate-intensity exercise). Do strengthening exercises at least twice a week. This is in addition to the moderate-intensity exercise. Spend less time sitting. Even light physical activity can be beneficial. Watch cholesterol and blood lipids Have your blood tested for lipids and cholesterol at 24 years of age, then have this test every 5 years. Have your cholesterol levels checked more often if: Your lipid or cholesterol levels are high. You are older than 24 years of age. You are at high risk for heart disease. What should I know about cancer screening? Depending on your health history and family history, you may need to have cancer screening at various ages. This may include screening for: Breast cancer. Cervical cancer. Colorectal cancer. Skin cancer. Lung cancer. What should I know about heart  disease, diabetes, and high blood pressure? Blood pressure and heart disease High blood pressure causes heart disease and increases the risk of stroke. This is more likely to develop in people who have high blood pressure readings or are overweight. Have your blood pressure checked: Every 3-5 years if you are 58-59 years of age. Every year if you are 74 years old or older. Diabetes Have regular diabetes screenings. This checks your fasting blood sugar level. Have the screening done: Once every three years after age 50 if you are at a normal weight and have a low risk for diabetes. More often and at a younger age if you are overweight or have a high risk for diabetes. What should I know about preventing infection? Hepatitis B If you have a higher risk for hepatitis B, you should be screened for this virus. Talk with your health care provider to find out if you are at risk for hepatitis B infection. Hepatitis C Testing is recommended for: Everyone born from [birth year] through 1966. Anyone with known risk factors for hepatitis C. Sexually transmitted infections (STIs) Get screened for STIs, including gonorrhea and chlamydia, if: You are sexually active and are younger than 24 years of age. You are older than 24 years of age and your health care provider tells you that you are at risk for this type of infection. Your sexual activity has changed since you were last screened, and you are at increased risk for chlamydia or gonorrhea. Ask your health care provider if you are at risk. Ask your health care provider about whether you are at high risk for  HIV. Your health care provider may recommend a prescription medicine to help prevent HIV infection. If you choose to take medicine to prevent HIV, you should first get tested for HIV. You should then be tested every 3 months for as long as you are taking the medicine. Pregnancy If you are about to stop having your period (premenopausal) and you may become  pregnant, seek counseling before you get pregnant. Take 400 to 800 micrograms (mcg) of folic acid  every day if you become pregnant. Ask for birth control (contraception) if you want to prevent pregnancy. Osteoporosis and menopause Osteoporosis is a disease in which the bones lose minerals and strength with aging. This can result in bone fractures. If you are 45 years old or older, or if you are at risk for osteoporosis and fractures, ask your health care provider if you should: Be screened for bone loss. Take a calcium or vitamin D  supplement to lower your risk of fractures. Be given hormone replacement therapy (HRT) to treat symptoms of menopause. Follow these instructions at home: Alcohol use Do not drink alcohol if: Your health care provider tells you not to drink. You are pregnant, may be pregnant, or are planning to become pregnant. If you drink alcohol: Limit how much you have to: 0-1 drink a day. Know how much alcohol is in your drink. In the [place] one drink equals one 12 oz bottle of beer (355 mL), one 5 oz glass of wine (148 mL), or one 1 oz glass of hard liquor (44 mL). Lifestyle Do not use any products that contain nicotine or tobacco. These products include cigarettes, chewing tobacco, and vaping devices, such as e-cigarettes. If you need help quitting, ask your health care provider. Do not use street drugs. Do not share needles. Ask your health care provider for help if you need support or information about quitting drugs. General instructions Schedule regular health, dental, and eye exams. Stay current with your vaccines. Tell your health care provider if: You often feel depressed. You have ever been abused or do not feel safe at home. Summary Adopting a healthy lifestyle and getting preventive care are important in promoting health and wellness. Follow your health care provider's instructions about healthy diet, exercising, and getting tested or screened for  diseases. Follow your health care provider's instructions on monitoring your cholesterol and blood pressure. This information is not intended to replace advice given to you by your health care provider. Make sure you discuss any questions you have with your health care provider. Document Revised: 10/04/2020 Document Reviewed: 10/04/2020 Elsevier Patient Education  2024 [employer].

## 2023-11-17 LAB — CMP14+EGFR
ALT: 16 IU/L (ref 0–32)
AST: 18 IU/L (ref 0–40)
Albumin: 4.5 g/dL (ref 4.0–5.0)
Alkaline Phosphatase: 57 IU/L (ref 44–121)
BUN/Creatinine Ratio: 20 (ref 9–23)
BUN: 15 mg/dL (ref 6–20)
Bilirubin Total: 0.3 mg/dL (ref 0.0–1.2)
CO2: 23 mmol/L (ref 20–29)
Calcium: 9.5 mg/dL (ref 8.7–10.2)
Chloride: 103 mmol/L (ref 96–106)
Creatinine, Ser: 0.75 mg/dL (ref 0.57–1.00)
Globulin, Total: 2.3 g/dL (ref 1.5–4.5)
Glucose: 80 mg/dL (ref 70–99)
Potassium: 4.8 mmol/L (ref 3.5–5.2)
Sodium: 140 mmol/L (ref 134–144)
Total Protein: 6.8 g/dL (ref 6.0–8.5)
eGFR: 115 mL/min/{1.73_m2} (ref >59)

## 2023-11-17 LAB — VITAMIN D 25 HYDROXY (VIT D DEFICIENCY, FRACTURES): Vit D, 25-Hydroxy: 24.3 ng/mL — ABNORMAL LOW (ref 30.0–100.0)

## 2023-11-17 LAB — CBC WITH DIFFERENTIAL/PLATELET
Basophils Absolute: 0.1 10*3/uL (ref 0.0–0.2)
Basos: 1 %
EOS (ABSOLUTE): 0.4 10*3/uL (ref 0.0–0.4)
Eos: 7 %
Hematocrit: 44.4 % (ref 34.0–46.6)
Hemoglobin: 14.1 g/dL (ref 11.1–15.9)
Immature Grans (Abs): 0 10*3/uL (ref 0.0–0.1)
Immature Granulocytes: 0 %
Lymphocytes Absolute: 1.4 10*3/uL (ref 0.7–3.1)
Lymphs: 27 %
MCH: 30.1 pg (ref 26.6–33.0)
MCHC: 31.8 g/dL (ref 31.5–35.7)
MCV: 95 fL (ref 79–97)
Monocytes Absolute: 0.4 10*3/uL (ref 0.1–0.9)
Monocytes: 7 %
Neutrophils Absolute: 3.1 10*3/uL (ref 1.4–7.0)
Neutrophils: 58 %
Platelets: 346 10*3/uL (ref 150–450)
RBC: 4.69 x10E6/uL (ref 3.77–5.28)
RDW: 12.3 % (ref 11.7–15.4)
WBC: 5.3 10*3/uL (ref 3.4–10.8)

## 2023-11-17 LAB — HEMOGLOBIN A1C
Est. average glucose Bld gHb Est-mCnc: 105 mg/dL
Hgb A1c MFr Bld: 5.3 % (ref 4.8–5.6)

## 2023-11-17 LAB — TSH: TSH: 1.37 u[IU]/mL (ref 0.450–4.500)

## 2023-11-18 ENCOUNTER — Other Ambulatory Visit: Payer: Self-pay | Admitting: Physician Assistant

## 2023-11-18 DIAGNOSIS — R632 Polyphagia: Secondary | ICD-10-CM

## 2023-11-18 DIAGNOSIS — F39 Unspecified mood [affective] disorder: Secondary | ICD-10-CM

## 2023-11-18 DIAGNOSIS — F331 Major depressive disorder, recurrent, moderate: Secondary | ICD-10-CM

## 2023-11-18 DIAGNOSIS — J301 Allergic rhinitis due to pollen: Secondary | ICD-10-CM

## 2023-11-18 DIAGNOSIS — F411 Generalized anxiety disorder: Secondary | ICD-10-CM

## 2023-11-19 ENCOUNTER — Ambulatory Visit: Payer: Self-pay | Admitting: Physician Assistant

## 2023-11-19 ENCOUNTER — Encounter: Payer: Self-pay | Admitting: Physician Assistant

## 2023-11-19 MED ORDER — CLONAZEPAM 0.5 MG PO TABS: ORAL_TABLET | ORAL | 1 refills | Status: AC

## 2023-11-19 NOTE — Progress Notes (Signed)
 Lauren Zamora,   Vitamin d  still low. Increase by 1000 units a day and take with dairy for better absorption.   Thyroid  looks great.  Hemoglobin looks good.  A1C is normal range, no diabetes or pre-diabetes.  Kidney, liver look great.

## 2023-11-19 NOTE — Progress Notes (Signed)
 Complete physical exam  Patient: Lauren Zamora   DOB: 2000-07-01   22 y.o. Female  MRN: [id number]  Subjective:    Chief Complaint  Patient presents with   Annual Exam    Lauren Zamora is a 23 y.o. female who presents today for a complete physical exam. She reports consuming a general diet. The patient does not participate in regular exercise at present. She generally feels well. She reports sleeping fairly well. She does have additional problems to discuss today.   She does not feel like her anxiety and depression is controlled. She continues to ruminate daily over things and make impulsive decisions. She is taking celexa , wellbutrin  and lamictal  and has helped but not to the goal of where she wants to be.    Most recent fall risk assessment:    11/19/2023    2:20 PM  Fall Risk   Falls in the past year? 0  Number falls in past yr: 0  Injury with Fall? 0  Risk for fall due to : No Fall Risks  Follow up Falls evaluation completed     Most recent depression screenings:    11/16/2023   10:09 AM 11/14/2022    1:27 PM  PHQ 2/9 Scores  PHQ - 2 Score 3 4  PHQ- 9 Score 19 19    Vision:Within last year and Dental: No current dental problems and Receives regular dental care  Patient Active Problem List   Diagnosis Date Noted   Bee sting allergy 11/14/2022   Low iron stores 08/15/2021   Low serum vitamin B12 08/15/2021   Borderline personality disorder (HCC) 07/16/2020   Binge eating 01/04/2020   Chest pain 07/07/2019   Moderate episode of recurrent major depressive disorder (HCC) 04/16/2019   Mood disorder (HCC) 04/16/2019   Deliberate self-cutting 04/16/2019   Folic acid  deficiency 10/03/2017   Panic attacks 10/02/2017   Weight gain 10/02/2017   No energy 10/02/2017   Persistent headaches 07/06/2017   Post concussion syndrome 07/06/2017   Low folate 12/19/2016   Dysmenorrhea in adolescent 12/13/2016   Menorrhagia with regular cycle 12/13/2016   Non-seasonal  allergic rhinitis 11/30/2016   GAD (generalized anxiety disorder) 08/11/2015   Asthma, mild intermittent 08/11/2015   Past Medical History:  Diagnosis Date   Asthma    Asthma, mild intermittent 08/11/2015   GAD (generalized anxiety disorder) 08/11/2015   GAD7 score 3 11/20/16   Pneumonitis 11/20/2016   Past Surgical History:  Procedure Laterality Date   NO PAST SURGERIES     Family History  Problem Relation Age of Onset   Anxiety disorder Mother    Depression Mother    Parkinson's disease Maternal Grandmother    Stroke Paternal Grandfather    Bipolar disorder Maternal Aunt    Allergies  Allergen Reactions   Bee Venom    Lavender Oil Anaphylaxis   Tea Anaphylaxis    Jasmine tea      Patient Care Team: Earla Charlie L, PA-C as PCP - General (Family Medicine)   Outpatient Medications Prior to Visit  Medication Sig   albuterol  (VENTOLIN  HFA) 108 (90 Base) MCG/ACT inhaler Inhale 2 puffs into the lungs every 6 (six) hours as needed for wheezing or shortness of breath.   cetirizine  (ZYRTEC ) 10 MG tablet Take 10 mg by mouth daily.   EPINEPHrine  0.3 mg/0.3 mL IJ SOAJ injection Inject 0.3 mg into the muscle once as needed.   norethindrone-ethinyl estradiol-iron (JUNEL FE 1.5/30) 1.5-30 MG-MCG tablet Take 1 tablet by mouth  daily.   [DISCONTINUED] buPROPion  (WELLBUTRIN  XL) 150 MG 24 hr tablet Take 1 tablet (150 mg total) by mouth daily.   [DISCONTINUED] citalopram  (CELEXA ) 40 MG tablet Take 1 tablet (40 mg total) by mouth daily.   [DISCONTINUED] clonazePAM  (KLONOPIN ) 0.5 MG tablet Take no more than daily as needed for panic attack.   [DISCONTINUED] lamoTRIgine  (LAMICTAL ) 100 MG tablet Take 1 tablet (100 mg total) by mouth daily.   [DISCONTINUED] montelukast  (SINGULAIR ) 10 MG tablet Take 1 tablet (10 mg total) by mouth at bedtime.   No facility-administered medications prior to visit.    ROS See HPI.        Objective:     BP 132/84   Pulse 75   Ht 4' 8 (1.422 m)   Wt  138 lb (62.6 kg)   SpO2 99%   BMI 30.94 kg/m  BP Readings from Last 3 Encounters:  11/16/23 132/84  11/14/22 118/63  07/04/22 122/88   Wt Readings from Last 3 Encounters:  11/16/23 138 lb (62.6 kg)  11/14/22 137 lb (62.1 kg)  07/04/22 140 lb (63.5 kg)    ..    11/16/2023   10:09 AM 11/14/2022    1:27 PM 11/14/2022   10:33 AM 07/04/2022   10:01 AM 08/12/2021    1:16 PM  Depression screen PHQ 2/9  Decreased Interest 1 2 2 3 2   Down, Depressed, Hopeless 2 2 2 2 3   PHQ - 2 Score 3 4 4 5 5   Altered sleeping 3 3   3   Tired, decreased energy 3 3   3   Change in appetite 3 3   3   Feeling bad or failure about yourself  1 2   3   Trouble concentrating 3 3   1   Moving slowly or fidgety/restless 1 0   3  Suicidal thoughts 2 1   0  PHQ-9 Score 19 19   21   Difficult doing work/chores Extremely dIfficult Very difficult   Extremely dIfficult   ..    11/16/2023   10:09 AM 11/14/2022    1:28 PM 08/12/2021    1:16 PM 07/16/2020   11:41 AM  GAD 7 : Generalized Anxiety Score  Nervous, Anxious, on Edge 3 3 3 3   Control/stop worrying 3 3 3 3   Worry too much - different things 3 3 3 3   Trouble relaxing 3 3 3 3   Restless 3 3 1 1   Easily annoyed or irritable 3 3 3 3   Afraid - awful might happen 3 3 3 3   Total GAD 7 Score 21 21 19 19   Anxiety Difficulty Extremely difficult Extremely difficult Extremely difficult Extremely difficult      Physical Exam  BP 132/84   Pulse 75   Ht 4' 8 (1.422 m)   Wt 138 lb (62.6 kg)   SpO2 99%   BMI 30.94 kg/m   General Appearance:    Alert, cooperative, obese, no distress, appears stated age  Head:    Normocephalic, without obvious abnormality, atraumatic  Eyes:    PERRL, conjunctiva/corneas clear, EOM's intact, fundi    benign, both eyes  Ears:    Normal TM's and external ear canals, both ears  Nose:   Nares normal, septum midline, mucosa normal, no drainage    or sinus tenderness  Throat:   Lips, mucosa, and tongue normal; teeth and gums normal   Neck:   Supple, symmetrical, trachea midline, no adenopathy;    thyroid :  no enlargement/tenderness/nodules; no carotid   bruit  or JVD  Back:     Symmetric, no curvature, ROM normal, no CVA tenderness  Lungs:     Clear to auscultation bilaterally, respirations unlabored  Chest Wall:    No tenderness or deformity   Heart:    Regular rate and rhythm, S1 and S2 normal, no murmur, rub   or gallop     Abdomen:     Soft, non-tender, bowel sounds active all four quadrants,    no masses, no organomegaly        Extremities:   Extremities normal, atraumatic, no cyanosis or edema  Pulses:   2+ and symmetric all extremities  Skin:   Skin color, texture, turgor normal, no rashes or lesions  Lymph nodes:   Cervical, supraclavicular, and axillary nodes normal  Neurologic:   CNII-XII intact, normal strength, sensation and reflexes    throughout      Assessment & Plan:    Routine Health Maintenance and Physical Exam  Immunization History  Administered Date(s) Administered   DTaP 05/08/2000, 07/17/2000, 09/11/2000, 09/10/2001, 03/17/2004   HIB (PRP-OMP) 05/08/2000, 07/17/2000, 09/11/2000, 03/27/2001   HIB, Unspecified 05/08/2000, 07/17/2000, 09/11/2000, 03/27/2001   HPV 9-valent 08/10/2015   HPV Quadrivalent 12/19/2012, 02/26/2013   Hepatitis A, Ped/Adol-2 Dose 03/04/2008, 05/18/2010   Hepatitis B, PED/ADOLESCENT 03-18-2000, 04/10/2000, 09/11/2000   IPV 05/08/2000, 07/17/2000, 03/27/2001, 03/17/2004   Influenza, Quadrivalent, Recombinant, Inj, Pf 03/02/2019   Influenza,inj,Quad PF,6+ Mos 02/21/2020, 01/29/2021, 02/21/2022   Influenza,inj,quad, With Preservative 03/04/2008, 03/24/2009, 04/12/2010   Influenza-Unspecified 03/04/2008, 03/24/2009, 04/12/2010, 04/10/2012, 04/28/2017   Janssen (J&J) SARS-COV-2 Vaccination 09/04/2019   MMR 03/27/2001, 03/17/2004   Meningococcal B, OMV 12/13/2016, 01/10/2017   Meningococcal Conjugate 06/15/2011   Meningococcal Mcv4o 12/13/2016   PNEUMOCOCCAL  CONJUGATE-20 11/16/2023   Pneumococcal Conjugate-13 05/08/2000, 07/17/2000, 09/11/2000, 03/10/2002   Tdap 06/15/2011, 08/12/2021   Varicella 03/27/2001, 07/13/2005    Health Maintenance  Topic Date Due   COVID-19 Vaccine (2 - 2024-25 season) 12/02/2023 (Originally 01/28/2023)   INFLUENZA VACCINE  12/28/2023   Cervical Cancer Screening (Pap smear)  11/13/2025   DTaP/Tdap/Td (8 - Td or Tdap) 08/13/2031   Pneumococcal Vaccine 68-94 Years old  Completed   HPV VACCINES  Completed   Hepatitis C Screening  Completed   HIV Screening  Completed   Meningococcal B Vaccine  Completed    Discussed health benefits of physical activity, and encouraged her to engage in regular exercise appropriate for her age and condition.  SABRASelestine was seen today for annual exam.  Diagnoses and all orders for this visit:  Immunization due -     Pneumococcal conjugate vaccine 20-valent (Prevnar 20)  Mild intermittent asthma without complication -     Pneumococcal conjugate vaccine 20-valent (Prevnar 20)  GAD (generalized anxiety disorder) -     CBC with Differential/Platelet -     CMP14+EGFR -     TSH -     VITAMIN D  25 Hydroxy (Vit-D Deficiency, Fractures) -     HgB A1c -     OLANZapine -FLUoxetine  (SYMBYAX ) 3-25 MG capsule; Take 1 capsule by mouth every evening.  Moderate episode of recurrent major depressive disorder (HCC) -     CBC with Differential/Platelet -     CMP14+EGFR -     TSH -     VITAMIN D  25 Hydroxy (Vit-D Deficiency, Fractures) -     HgB A1c -     OLANZapine -FLUoxetine  (SYMBYAX ) 3-25 MG capsule; Take 1 capsule by mouth every evening.  Mood disorder (HCC) -     CBC  with Differential/Platelet -     CMP14+EGFR -     TSH -     VITAMIN D  25 Hydroxy (Vit-D Deficiency, Fractures) -     HgB A1c -     OLANZapine -FLUoxetine  (SYMBYAX ) 3-25 MG capsule; Take 1 capsule by mouth every evening.  Routine physical examination -     CBC with Differential/Platelet -     CMP14+EGFR -     TSH -      VITAMIN D  25 Hydroxy (Vit-D Deficiency, Fractures) -     HgB A1c  Class 1 obesity due to excess calories without serious comorbidity with body mass index (BMI) of 30.0 to 30.9 in adult  Screening for diabetes mellitus -     CMP14+EGFR -     HgB A1c  Panic attacks -     clonazePAM  (KLONOPIN ) 0.5 MG tablet; Take no more than daily as needed for panic attack.   .. Discussed 150 minutes of exercise a week.  Encouraged vitamin D  1000 units and Calcium 1300mg  or 4 servings of dairy a day.  Fasting labs ordered today PHQ/GAD not to goal Titrate off celexa  and on to symbyax  Continue on lamictal  and wellbutrin  Use klonapin as needed only, discussed dependence risk Follow up in 7 weeks Pap UTD Pneumonia vaccine 20 given today Pt declines all other vaccines  Return in about 7 weeks (around 01/04/2024).     Grier Vu, PA-C

## 2023-11-19 NOTE — Telephone Encounter (Signed)
 Please advise on refill request

## 2023-11-20 ENCOUNTER — Telehealth: Payer: Self-pay

## 2023-11-20 ENCOUNTER — Other Ambulatory Visit (HOSPITAL_COMMUNITY): Payer: Self-pay

## 2023-11-20 NOTE — Telephone Encounter (Signed)
 Pharmacy Patient Advocate Encounter   Received notification from CoverMyMeds that prior authorization for OLANZapine -FLUoxetine  HCl 3-25MG  capsules is required/requested.   Insurance verification completed.   The patient is insured through [facility] .   Per test claim: PA required and submitted KEY/EOC/Request #: [id number] due to: Prior Authorization Not Required   Called insurance at [phone] option 3, per representative, they cannot do PA over the phone and directed me to [email or web address] or [employer] to submit PA. Per [email or web address]/, Olanzapine -Fluoxetine  is not on formulary and no PA form was available.   Submitted PA through [employer]. Rx Case # [id number]

## 2023-11-21 ENCOUNTER — Other Ambulatory Visit (HOSPITAL_COMMUNITY): Payer: Self-pay

## 2023-11-21 NOTE — Telephone Encounter (Signed)
 Pharmacy Patient Advocate Encounter  Received notification from [facility] that Prior Authorization for  OLANZapine -FLUoxetine  HCl 3-25MG  capsules  has been APPROVED from 11/20/23 to 11/19/24. Unable to obtain price due to refill too soon rejection, last fill date 11/21/23 next available fill date7/18/25   PA #/Case ID/Reference #: [id number]

## 2023-11-27 ENCOUNTER — Other Ambulatory Visit (HOSPITAL_COMMUNITY): Payer: Self-pay

## 2023-11-28 DIAGNOSIS — F429 Obsessive-compulsive disorder, unspecified: Secondary | ICD-10-CM | POA: Diagnosis not present

## 2023-12-05 DIAGNOSIS — F429 Obsessive-compulsive disorder, unspecified: Secondary | ICD-10-CM | POA: Diagnosis not present

## 2023-12-12 DIAGNOSIS — F429 Obsessive-compulsive disorder, unspecified: Secondary | ICD-10-CM | POA: Diagnosis not present

## 2023-12-17 DIAGNOSIS — F429 Obsessive-compulsive disorder, unspecified: Secondary | ICD-10-CM | POA: Diagnosis not present

## 2023-12-19 ENCOUNTER — Other Ambulatory Visit: Payer: Self-pay | Admitting: Physician Assistant

## 2023-12-19 DIAGNOSIS — F411 Generalized anxiety disorder: Secondary | ICD-10-CM

## 2023-12-19 DIAGNOSIS — F39 Unspecified mood [affective] disorder: Secondary | ICD-10-CM

## 2023-12-19 DIAGNOSIS — F331 Major depressive disorder, recurrent, moderate: Secondary | ICD-10-CM

## 2023-12-26 DIAGNOSIS — F429 Obsessive-compulsive disorder, unspecified: Secondary | ICD-10-CM | POA: Diagnosis not present

## 2024-01-02 ENCOUNTER — Ambulatory Visit: Admitting: Physician Assistant

## 2024-01-02 VITALS — BP 125/85 | HR 74 | Ht <= 58 in | Wt 138.0 lb

## 2024-01-02 DIAGNOSIS — F331 Major depressive disorder, recurrent, moderate: Secondary | ICD-10-CM | POA: Diagnosis not present

## 2024-01-02 DIAGNOSIS — F39 Unspecified mood [affective] disorder: Secondary | ICD-10-CM

## 2024-01-02 DIAGNOSIS — F411 Generalized anxiety disorder: Secondary | ICD-10-CM | POA: Diagnosis not present

## 2024-01-02 DIAGNOSIS — J4520 Mild intermittent asthma, uncomplicated: Secondary | ICD-10-CM

## 2024-01-02 DIAGNOSIS — F410 Panic disorder [episodic paroxysmal anxiety] without agoraphobia: Secondary | ICD-10-CM

## 2024-01-02 MED ORDER — OLANZAPINE-FLUOXETINE HCL 6-50 MG PO CAPS: 1.0000 | ORAL_CAPSULE | Freq: Every day | ORAL | 1 refills | Status: DC

## 2024-01-02 NOTE — Patient Instructions (Signed)
 Increased to 6/50mg , let me know how you are doing via myschart in next month

## 2024-01-02 NOTE — Progress Notes (Unsigned)
 Established Patient Office Visit  Subjective   Patient ID: Lauren Zamora, female    DOB: 11-15-2000  Age: 23 y.o. MRN: [id number]  Chief Complaint  Patient presents with   Medical Management of Chronic Issues    Panic attacks still trigger with no warning feels like new medication is not working well enough to notice a change     HPI Pt is a 23 yo female who presents to the clinic to follow up on mood, anxiety, depression and panic attacks. She does admit that her panic attacks have decreased. She has one in the last 2 months but it was a really bad one. She is still feeling anxious and on edge. She uses the klonapin as needed. She is taking the symbyax  daily. She continues to have trouble sleeping both going and staying asleep. No SI/HC. She sees her counselor weekly. That has been helpful.    ROS See HPI   Objective:     BP 125/85   Pulse 74   Ht 4' 8 (1.422 m)   Wt 138 lb (62.6 kg)   SpO2 99%   BMI 30.94 kg/m  BP Readings from Last 3 Encounters:  01/02/24 125/85  11/16/23 132/84  11/14/22 118/63   Wt Readings from Last 3 Encounters:  01/02/24 138 lb (62.6 kg)  11/16/23 138 lb (62.6 kg)  11/14/22 137 lb (62.1 kg)     ..    01/02/2024   10:12 AM 11/16/2023   10:09 AM 11/14/2022    1:27 PM 11/14/2022   10:33 AM 07/04/2022   10:01 AM  Depression screen PHQ 2/9  Decreased Interest 2 1 2 2 3   Down, Depressed, Hopeless 2 2 2 2 2   PHQ - 2 Score 4 3 4 4 5   Altered sleeping 3 3 3     Tired, decreased energy 3 3 3     Change in appetite 3 3 3     Feeling bad or failure about yourself  3 1 2     Trouble concentrating 1 3 3     Moving slowly or fidgety/restless 0 1 0    Suicidal thoughts 2 2 1     PHQ-9 Score 19 19 19     Difficult doing work/chores Somewhat difficult Extremely dIfficult Very difficult     ..    01/02/2024   10:12 AM 11/16/2023   10:09 AM 11/14/2022    1:28 PM 08/12/2021    1:16 PM  GAD 7 : Generalized Anxiety Score  Nervous, Anxious, on Edge 3 3 3 3    Control/stop worrying 3 3 3 3   Worry too much - different things 3 3 3 3   Trouble relaxing 3 3 3 3   Restless 3 3 3 1   Easily annoyed or irritable 3 3 3 3   Afraid - awful might happen 3 3 3 3   Total GAD 7 Score 21 21 21 19   Anxiety Difficulty Somewhat difficult Extremely difficult Extremely difficult Extremely difficult     Physical Exam Constitutional:      Appearance: Normal appearance.  Cardiovascular:     Rate and Rhythm: Normal rate and regular rhythm.  Pulmonary:     Effort: Pulmonary effort is normal.     Breath sounds: Normal breath sounds.  Neurological:     General: No focal deficit present.     Mental Status: She is alert and oriented to person, place, and time.  Psychiatric:        Mood and Affect: Mood normal.  Assessment & Plan:  Lauren Zamora was seen today for medical management of chronic issues.  Diagnoses and all orders for this visit:  GAD (generalized anxiety disorder) -     OLANZapine -FLUoxetine  (SYMBYAX ) 6-50 MG capsule; Take 1 capsule by mouth at bedtime.  Mild intermittent asthma without complication  Moderate episode of recurrent major depressive disorder (HCC) -     OLANZapine -FLUoxetine  (SYMBYAX ) 6-50 MG capsule; Take 1 capsule by mouth at bedtime.  Mood disorder (HCC) -     OLANZapine -FLUoxetine  (SYMBYAX ) 6-50 MG capsule; Take 1 capsule by mouth at bedtime.  Panic attack   No change in PHQ/GAD but patient reports she has not had but one panic attack since starting Lets increase symbyax  and see if any improvement Continue klonapin for panic attacks as needed Follow up in 4-6 weeks Continue with weekly counseling   Lauren Bologna, PA-C

## 2024-01-04 ENCOUNTER — Encounter: Payer: Self-pay | Admitting: Physician Assistant

## 2024-01-11 DIAGNOSIS — F429 Obsessive-compulsive disorder, unspecified: Secondary | ICD-10-CM | POA: Diagnosis not present

## 2024-01-16 DIAGNOSIS — F429 Obsessive-compulsive disorder, unspecified: Secondary | ICD-10-CM | POA: Diagnosis not present

## 2024-01-23 DIAGNOSIS — F429 Obsessive-compulsive disorder, unspecified: Secondary | ICD-10-CM | POA: Diagnosis not present

## 2024-01-31 DIAGNOSIS — F429 Obsessive-compulsive disorder, unspecified: Secondary | ICD-10-CM | POA: Diagnosis not present

## 2024-02-02 ENCOUNTER — Other Ambulatory Visit: Payer: Self-pay | Admitting: Physician Assistant

## 2024-02-02 DIAGNOSIS — N920 Excessive and frequent menstruation with regular cycle: Secondary | ICD-10-CM

## 2024-02-06 DIAGNOSIS — F429 Obsessive-compulsive disorder, unspecified: Secondary | ICD-10-CM | POA: Diagnosis not present

## 2024-02-13 ENCOUNTER — Other Ambulatory Visit: Payer: Self-pay | Admitting: Physician Assistant

## 2024-02-13 DIAGNOSIS — F39 Unspecified mood [affective] disorder: Secondary | ICD-10-CM

## 2024-02-13 DIAGNOSIS — F331 Major depressive disorder, recurrent, moderate: Secondary | ICD-10-CM

## 2024-02-13 DIAGNOSIS — F429 Obsessive-compulsive disorder, unspecified: Secondary | ICD-10-CM | POA: Diagnosis not present

## 2024-02-13 DIAGNOSIS — F411 Generalized anxiety disorder: Secondary | ICD-10-CM

## 2024-02-20 DIAGNOSIS — F429 Obsessive-compulsive disorder, unspecified: Secondary | ICD-10-CM | POA: Diagnosis not present

## 2024-03-13 DIAGNOSIS — F429 Obsessive-compulsive disorder, unspecified: Secondary | ICD-10-CM | POA: Diagnosis not present

## 2024-03-19 DIAGNOSIS — F429 Obsessive-compulsive disorder, unspecified: Secondary | ICD-10-CM | POA: Diagnosis not present

## 2024-03-26 DIAGNOSIS — F429 Obsessive-compulsive disorder, unspecified: Secondary | ICD-10-CM | POA: Diagnosis not present

## 2024-04-02 ENCOUNTER — Ambulatory Visit: Admitting: Physician Assistant

## 2024-04-02 ENCOUNTER — Encounter: Payer: Self-pay | Admitting: Physician Assistant

## 2024-04-02 VITALS — BP 138/82 | HR 84 | Temp 98.2°F | Ht 59.0 in | Wt 134.0 lb

## 2024-04-02 DIAGNOSIS — F39 Unspecified mood [affective] disorder: Secondary | ICD-10-CM | POA: Diagnosis not present

## 2024-04-02 DIAGNOSIS — J0140 Acute pansinusitis, unspecified: Secondary | ICD-10-CM | POA: Insufficient documentation

## 2024-04-02 DIAGNOSIS — F429 Obsessive-compulsive disorder, unspecified: Secondary | ICD-10-CM | POA: Diagnosis not present

## 2024-04-02 DIAGNOSIS — F411 Generalized anxiety disorder: Secondary | ICD-10-CM | POA: Diagnosis not present

## 2024-04-02 DIAGNOSIS — F331 Major depressive disorder, recurrent, moderate: Secondary | ICD-10-CM

## 2024-04-02 DIAGNOSIS — R6889 Other general symptoms and signs: Secondary | ICD-10-CM

## 2024-04-02 LAB — POC SOFIA 2 FLU + SARS ANTIGEN FIA
Influenza A, POC: NEGATIVE
Influenza B, POC: NEGATIVE
SARS Coronavirus 2 Ag: NEGATIVE

## 2024-04-02 LAB — POCT RAPID STREP A (OFFICE): Rapid Strep A Screen: NEGATIVE

## 2024-04-02 MED ORDER — OLANZAPINE-FLUOXETINE HCL 6-50 MG PO CAPS: 1.0000 | ORAL_CAPSULE | Freq: Every day | ORAL | 1 refills | Status: AC

## 2024-04-02 MED ORDER — AMOXICILLIN-POT CLAVULANATE 875-125 MG PO TABS: 1.0000 | ORAL_TABLET | Freq: Two times a day (BID) | ORAL | 0 refills | Status: DC

## 2024-04-02 NOTE — Progress Notes (Unsigned)
 Established Patient Office Visit  Subjective   Patient ID: Lauren Zamora, female    DOB: 2000/09/06  Age: 23 y.o. MRN: [id number]  Chief Complaint  Patient presents with   Medical Management of Chronic Issues    Patient is a 23 yo female presenting for medication management for mood. Pt currently takes Symbyax , Lamictal , and Wellbutrin .  She states her mood is doing well and has not had any panic attacks lately. At last appointment, she was advised to taper down and discontinue Citalopram  which caused withdrawal symptoms, but was able to successfully discontinue without issue once symptoms subsided. No adverse effects noted for current medication regimen.   Pt states she has not been feeling well for about 5 days. She states she has had congestions and a mucous producing cough without  relief from OTC remedies. She describes the mucous as greenish-yellow and thick in consistency. Pt states she took an at home COVID-19 and Flu test which produced a negative result. She denies sore throat, chest pain, wheezing, nausea, vomiting, and ear pain at this time. She endorses a low grade fever and some shortness of breath when coughing.   Review of Systems  Constitutional:  Positive for fever. Negative for malaise/fatigue.       Pt states she's had a low grade fever of about 99 degrees.   HENT:  Positive for congestion. Negative for ear discharge, ear pain and sore throat.   Eyes:  Negative for pain, discharge and redness.  Respiratory:  Positive for cough, sputum production and shortness of breath. Negative for hemoptysis and wheezing.   Cardiovascular:  Negative for chest pain.  Gastrointestinal:  Negative for abdominal pain, constipation, diarrhea, nausea and vomiting.  Psychiatric/Behavioral:  Negative for depression and suicidal ideas. The patient is not nervous/anxious.   All other systems reviewed and are negative.      Objective:     BP 138/82   Pulse 84   Temp 98.2 F (36.8 C)  (Oral)   Ht 4' 11 (1.499 m)   Wt 134 lb (60.8 kg)   SpO2 99%   BMI 27.06 kg/m  BP Readings from Last 3 Encounters:  04/02/24 138/82  01/02/24 125/85  11/16/23 132/84   Wt Readings from Last 3 Encounters:  04/02/24 134 lb (60.8 kg)  01/02/24 138 lb (62.6 kg)  11/16/23 138 lb (62.6 kg)      Physical Exam Constitutional:      Appearance: Normal appearance.  HENT:     Head: Normocephalic and atraumatic.     Right Ear: Hearing, tympanic membrane, ear canal and external ear normal.     Left Ear: Hearing, tympanic membrane, ear canal and external ear normal.     Nose: Congestion present.     Mouth/Throat:     Mouth: Mucous membranes are moist.     Pharynx: Oropharynx is clear. No oropharyngeal exudate or posterior oropharyngeal erythema.  Cardiovascular:     Rate and Rhythm: Normal rate and regular rhythm.     Heart sounds: Normal heart sounds. No murmur heard.    No friction rub. No gallop.  Pulmonary:     Effort: Pulmonary effort is normal.     Breath sounds: Normal breath sounds. No wheezing, rhonchi or rales.  Musculoskeletal:     Cervical back: Neck supple.  Lymphadenopathy:     Cervical: No cervical adenopathy.  Neurological:     General: No focal deficit present.     Mental Status: She is alert and oriented to person,  place, and time. Mental status is at baseline.  Psychiatric:        Mood and Affect: Mood normal.        Behavior: Behavior normal.        Thought Content: Thought content normal.        Judgment: Judgment normal.    ..    04/02/2024    8:59 AM 01/02/2024   10:12 AM 11/16/2023   10:09 AM 11/14/2022    1:27 PM 11/14/2022   10:33 AM  Depression screen PHQ 2/9  Decreased Interest 0 2 1 2 2   Down, Depressed, Hopeless 0 2 2 2 2   PHQ - 2 Score 0 4 3 4 4   Altered sleeping 0 3 3 3    Tired, decreased energy 0 3 3 3    Change in appetite 0 3 3 3    Feeling bad or failure about yourself  0 3 1 2    Trouble concentrating 0 1 3 3    Moving slowly or  fidgety/restless 0 0 1 0   Suicidal thoughts 0 2 2 1    PHQ-9 Score 0  19  19  19     Difficult doing work/chores Not difficult at all Somewhat difficult Extremely dIfficult Very difficult      Data saved with a previous flowsheet row definition   .Lauren Zamora    04/02/2024    8:59 AM 01/02/2024   10:12 AM 11/16/2023   10:09 AM 11/14/2022    1:28 PM  GAD 7 : Generalized Anxiety Score  Nervous, Anxious, on Edge 0 3 3 3   Control/stop worrying 0 3 3 3   Worry too much - different things 0 3 3 3   Trouble relaxing 0 3 3 3   Restless 0 3 3 3   Easily annoyed or irritable 0 3 3 3   Afraid - awful might happen 0 3 3 3   Total GAD 7 Score 0 21 21 21   Anxiety Difficulty Not difficult at all Somewhat difficult Extremely difficult Extremely difficult      Results for orders placed or performed in visit on 04/02/24  POC SOFIA 2 FLU + SARS ANTIGEN FIA  Result Value Ref Range   Influenza A, POC Negative Negative   Influenza B, POC Negative Negative   SARS Coronavirus 2 Ag Negative Negative  POCT rapid strep A  Result Value Ref Range   Rapid Strep A Screen Negative Negative       Assessment & Plan:  [name] was seen today for medical management of chronic issues.  Diagnoses and all orders for this visit:  Acute non-recurrent pansinusitis -     amoxicillin -clavulanate (AUGMENTIN ) 875-125 MG tablet; Take 1 tablet by mouth 2 (two) times daily.  GAD (generalized anxiety disorder) -     OLANZapine -FLUoxetine  (SYMBYAX ) 6-50 MG capsule; Take 1 capsule by mouth at bedtime. TAKE 1 CAPSULE BY MOUTH EVERYDAY AT BEDTIME  Moderate episode of recurrent major depressive disorder (HCC) -     OLANZapine -FLUoxetine  (SYMBYAX ) 6-50 MG capsule; Take 1 capsule by mouth at bedtime. TAKE 1 CAPSULE BY MOUTH EVERYDAY AT BEDTIME  Mood disorder -     OLANZapine -FLUoxetine  (SYMBYAX ) 6-50 MG capsule; Take 1 capsule by mouth at bedtime. TAKE 1 CAPSULE BY MOUTH EVERYDAY AT BEDTIME  Flu-like symptoms -     POC SOFIA 2 FLU +  SARS ANTIGEN FIA -     POCT rapid strep A   GAD/MDD/MOOd disorder PHQ/GAD much better - refilled symbyax  Follow up in 6 months  Sinusitis Flu/covid/strep negative - On  day 5 of symptoms, concern for secondary bacteria infection. Treated with augmentin  for 10 days - Continue symptomatic care - follow up if not improving or symptoms worsening  BP elevated due to illness - 2nd recheck improved   Return in about 6 months (around 09/30/2024).    Nonnie Pickney, PA-C

## 2024-04-02 NOTE — Assessment & Plan Note (Addendum)
 Strep, Flu, and COVID were all negative  Start Augmentin  for acute sinusitis  If symptoms worsen, please return to the office.

## 2024-04-02 NOTE — Assessment & Plan Note (Signed)
 PHQ/GAD are to goal!  Continue medication regimen as directed If symptoms begin or mood worsens, please contact the office.

## 2024-04-04 ENCOUNTER — Encounter: Payer: Self-pay | Admitting: Physician Assistant

## 2024-04-09 DIAGNOSIS — F429 Obsessive-compulsive disorder, unspecified: Secondary | ICD-10-CM | POA: Diagnosis not present

## 2024-04-18 DIAGNOSIS — F429 Obsessive-compulsive disorder, unspecified: Secondary | ICD-10-CM | POA: Diagnosis not present

## 2024-04-29 ENCOUNTER — Other Ambulatory Visit: Payer: Self-pay | Admitting: Physician Assistant

## 2024-04-29 DIAGNOSIS — N920 Excessive and frequent menstruation with regular cycle: Secondary | ICD-10-CM

## 2024-04-30 DIAGNOSIS — F429 Obsessive-compulsive disorder, unspecified: Secondary | ICD-10-CM | POA: Diagnosis not present

## 2024-05-07 DIAGNOSIS — F429 Obsessive-compulsive disorder, unspecified: Secondary | ICD-10-CM | POA: Diagnosis not present

## 2024-07-04 ENCOUNTER — Ambulatory Visit: Admitting: Physician Assistant

## 2024-07-04 VITALS — BP 123/71 | HR 102 | Wt 165.0 lb

## 2024-07-04 DIAGNOSIS — F39 Unspecified mood [affective] disorder: Secondary | ICD-10-CM

## 2024-07-04 DIAGNOSIS — J209 Acute bronchitis, unspecified: Secondary | ICD-10-CM

## 2024-07-04 DIAGNOSIS — F331 Major depressive disorder, recurrent, moderate: Secondary | ICD-10-CM

## 2024-07-04 DIAGNOSIS — F411 Generalized anxiety disorder: Secondary | ICD-10-CM

## 2024-07-04 MED ORDER — SERTRALINE HCL 100 MG PO TABS: 100.0000 mg | ORAL_TABLET | Freq: Every day | ORAL | 1 refills | Status: AC

## 2024-07-04 MED ORDER — HYDROCODONE BIT-HOMATROP MBR 5-1.5 MG/5ML PO SOLN: 5.0000 mL | Freq: Three times a day (TID) | ORAL | 0 refills | Status: AC | PRN

## 2024-07-04 MED ORDER — METHYLPREDNISOLONE 4 MG PO TBPK: ORAL_TABLET | ORAL | 0 refills | Status: AC

## 2024-07-04 MED ORDER — LAMOTRIGINE 150 MG PO TABS: 150.0000 mg | ORAL_TABLET | Freq: Every day | ORAL | 0 refills | Status: AC

## 2024-07-04 NOTE — Patient Instructions (Signed)
 Alternate one half tablet of zoloft  and one tablet of symbyax  for 10 days then stop symbyax  and increase to one full tablet of zoloft .   Increased lamictal  to 150mg  daily.

## 2024-08-01 ENCOUNTER — Ambulatory Visit: Admitting: Physician Assistant

## 2024-09-30 ENCOUNTER — Ambulatory Visit: Admitting: Physician Assistant
